# Patient Record
Sex: Female | Born: 1940 | Race: White | Hispanic: No | Smoking: Never smoker
Health system: Southern US, Community
[De-identification: ages and names within clinical notes are randomized; demographics above are authoritative.]

## PROBLEM LIST (undated history)

## (undated) DIAGNOSIS — I10 Essential (primary) hypertension: Secondary | ICD-10-CM

## (undated) DIAGNOSIS — C801 Malignant (primary) neoplasm, unspecified: Secondary | ICD-10-CM

## (undated) DIAGNOSIS — M1990 Unspecified osteoarthritis, unspecified site: Secondary | ICD-10-CM

## (undated) DIAGNOSIS — F419 Anxiety disorder, unspecified: Secondary | ICD-10-CM

## (undated) HISTORY — PX: CARPAL TUNNEL RELEASE: SHX101

## (undated) HISTORY — PX: ABDOMINAL HYSTERECTOMY: SHX81

## (undated) HISTORY — PX: HIATAL HERNIA REPAIR: SHX195

## (undated) HISTORY — PX: SKIN CANCER EXCISION: SHX779

## (undated) HISTORY — PX: KNEE ARTHROSCOPY: SHX127

---

## 2011-05-11 ENCOUNTER — Encounter (HOSPITAL_COMMUNITY): Payer: Self-pay | Admitting: Pharmacy Technician

## 2011-05-12 ENCOUNTER — Encounter (HOSPITAL_COMMUNITY): Payer: Medicare Other

## 2011-05-13 ENCOUNTER — Other Ambulatory Visit (HOSPITAL_COMMUNITY): Payer: Medicare Other

## 2011-05-13 NOTE — Patient Instructions (Addendum)
22 Kristin Wyatt  05/13/2011   Your procedure is scheduled on:  05/17/2011  Report to [facility] at  1000  AM.  Call this number if you have problems the morning of surgery: [phone]   Remember:   Do not eat food:After Midnight.  May have clear liquids:until Midnight .  Clear liquids include soda, tea, black coffee, apple or grape juice, broth.  Take these medicines the morning of surgery with A SIP OF WATER: xanax,diltiazem,hydroduiril   Do not wear jewelry, make-up or nail polish.  Do not wear lotions, powders, or perfumes. You may wear deodorant.  Do not shave 48 hours prior to surgery.  Do not bring valuables to the hospital.  Contacts, dentures or bridgework may not be worn into surgery.  Leave suitcase in the car. After surgery it may be brought to your room.  For patients admitted to the hospital, checkout time is 11:00 AM the day of discharge.   Patients discharged the day of surgery will not be allowed to drive home.  Name and phone number of your driver: family  Special Instructions: N/A   Please read over the following fact sheets that you were given: Pain Booklet, MRSA Information, Surgical Site Infection Prevention, Anesthesia Post-op Instructions and Care and Recovery After Surgery Cataract A cataract is a clouding of the lens of the eye. When a lens becomes cloudy, vision is reduced based on the degree and nature of the clouding. Many cataracts reduce vision to some degree. Some cataracts make people more near-sighted as they develop. Other cataracts increase glare. Cataracts that are ignored and become worse can sometimes look white. The white color can be seen through the pupil. CAUSES   Aging. However, cataracts may occur at any age, even in newborns.   Certain drugs.   Trauma to the eye.   Certain diseases such as diabetes.   Specific eye diseases such as chronic inflammation inside the eye or a sudden attack of a rare form of glaucoma.   Inherited or  acquired medical problems.  SYMPTOMS   Gradual, progressive drop in vision in the affected eye.   Severe, rapid visual loss. This most often happens when trauma is the cause.  DIAGNOSIS  To detect a cataract, an eye doctor examines the lens. Cataracts are best diagnosed with an exam of the eyes with the pupils enlarged (dilated) by drops.  TREATMENT  For an early cataract, vision may improve by using different eyeglasses or stronger lighting. If that does not help your vision, surgery is the only effective treatment. A cataract needs to be surgically removed when vision loss interferes with your everyday activities, such as driving, reading, or watching TV. A cataract may also have to be removed if it prevents examination or treatment of another eye problem. Surgery removes the cloudy lens and usually replaces it with a substitute lens (intraocular lens, IOL).  At a time when both you and your doctor agree, the cataract will be surgically removed. If you have cataracts in both eyes, only one is usually removed at a time. This allows the operated eye to heal and be out of danger from any possible problems after surgery (such as infection or poor wound healing). In rare cases, a cataract may be doing damage to your eye. In these cases, your caregiver may advise surgical removal right away. The vast majority of people who have cataract surgery have better vision afterward. HOME CARE INSTRUCTIONS  If you are not planning surgery, you may  be asked to do the following:  Use different eyeglasses.   Use stronger or brighter lighting.   Ask your eye doctor about reducing your medicine dose or changing medicines if it is thought that a medicine caused your cataract. Changing medicines does not make the cataract go away on its own.   Become familiar with your surroundings. Poor vision can lead to injury. Avoid bumping into things on the affected side. You are at a higher risk for tripping or falling.    Exercise extreme care when driving or operating machinery.   Wear sunglasses if you are sensitive to bright light or experiencing problems with glare.  SEEK IMMEDIATE MEDICAL CARE IF:   You have a worsening or sudden vision loss.   You notice redness, swelling, or increasing pain in the eye.   You have a fever.  Document Released: 02/22/2005 Document Revised: 02/11/2011 Document Reviewed: 10/16/2010 [facility] Patient Information 2012 [place], [place].PATIENT INSTRUCTIONS POST-ANESTHESIA  IMMEDIATELY FOLLOWING SURGERY:  Do not drive or operate machinery for the first twenty four hours after surgery.  Do not make any important decisions for twenty four hours after surgery or while taking narcotic pain medications or sedatives.  If you develop intractable nausea and vomiting or a severe headache please notify your doctor immediately.  FOLLOW-UP:  Please make an appointment with your surgeon as instructed. You do not need to follow up with anesthesia unless specifically instructed to do so.  WOUND CARE INSTRUCTIONS (if applicable):  Keep a dry clean dressing on the anesthesia/puncture wound site if there is drainage.  Once the wound has quit draining you may leave it open to air.  Generally you should leave the bandage intact for twenty four hours unless there is drainage.  If the epidural site drains for more than 36-48 hours please call the anesthesia department.  QUESTIONS?:  Please feel free to call your physician or the hospital operator if you have any questions, and they will be happy to assist you.     [facility] Anesthesia Department [address] [place] [phone]

## 2011-05-14 ENCOUNTER — Encounter (HOSPITAL_COMMUNITY)
Admission: RE | Admit: 2011-05-14 | Discharge: 2011-05-14 | Disposition: A | Discharge status: Home / Self Care | Payer: Medicare Other | Source: Ambulatory Visit | Attending: Ophthalmology | Admitting: Ophthalmology

## 2011-05-14 ENCOUNTER — Encounter (HOSPITAL_COMMUNITY): Payer: Self-pay

## 2011-05-14 ENCOUNTER — Other Ambulatory Visit: Payer: Self-pay

## 2011-05-14 HISTORY — DX: Malignant (primary) neoplasm, unspecified: C80.1

## 2011-05-14 HISTORY — DX: Unspecified osteoarthritis, unspecified site: M19.90

## 2011-05-14 HISTORY — DX: Essential (primary) hypertension: I10

## 2011-05-14 HISTORY — DX: Anxiety disorder, unspecified: F41.9

## 2011-05-14 LAB — BASIC METABOLIC PANEL
CO2: 30 mEq/L (ref 19–32)
Calcium: 9.9 mg/dL (ref 8.4–10.5)
Glucose, Bld: 93 mg/dL (ref 70–99)
Sodium: 141 mEq/L (ref 135–145)

## 2011-05-14 MED ORDER — LIDOCAINE HCL (PF) 1 % IJ SOLN
INTRAMUSCULAR | Status: AC
  Filled 2011-05-14: qty 2

## 2011-05-14 MED ORDER — TETRACAINE HCL 0.5 % OP SOLN
OPHTHALMIC | Status: AC
  Filled 2011-05-14: qty 2

## 2011-05-14 MED ORDER — CYCLOPENTOLATE-PHENYLEPHRINE 0.2-1 % OP SOLN
OPHTHALMIC | Status: AC
  Filled 2011-05-14: qty 2

## 2011-05-14 MED ORDER — NEOMYCIN-POLYMYXIN-DEXAMETH 3.5-10000-0.1 OP OINT
TOPICAL_OINTMENT | OPHTHALMIC | Status: AC
Start: 2011-05-14 — End: 2011-05-15
  Filled 2011-05-14: qty 3.5

## 2011-05-14 NOTE — Progress Notes (Signed)
05/14/11 1009  OBSTRUCTIVE SLEEP APNEA  Have you ever been diagnosed with sleep apnea through a sleep study? No  Do you snore loudly (loud enough to be heard through closed doors)?  1  Do you often feel tired, fatigued, or sleepy during the daytime? 1  Has anyone observed you stop breathing during your sleep? 0  Do you have, or are you being treated for high blood pressure? 1  BMI more than 35 kg/m2? 0  Age over 69 years old? 1  Neck circumference greater than 40 cm/18 inches? 0  Gender: 0  Obstructive Sleep Apnea Score 4   Score 4 or greater  Updated health history

## 2011-05-17 ENCOUNTER — Encounter (HOSPITAL_COMMUNITY): Payer: Self-pay | Admitting: Anesthesiology

## 2011-05-17 ENCOUNTER — Encounter (HOSPITAL_COMMUNITY)
Admission: RE | Disposition: A | Discharge status: Home / Self Care | Payer: Self-pay | Source: Ambulatory Visit | Attending: Ophthalmology

## 2011-05-17 ENCOUNTER — Ambulatory Visit (HOSPITAL_COMMUNITY)
Admission: RE | Admit: 2011-05-17 | Discharge: 2011-05-17 | Disposition: A | Discharge status: Home / Self Care | Payer: Medicare Other | Source: Ambulatory Visit | Attending: Ophthalmology | Admitting: Ophthalmology

## 2011-05-17 ENCOUNTER — Encounter (HOSPITAL_COMMUNITY): Payer: Self-pay | Admitting: *Deleted

## 2011-05-17 ENCOUNTER — Ambulatory Visit (HOSPITAL_COMMUNITY): Payer: Medicare Other | Admitting: Anesthesiology

## 2011-05-17 DIAGNOSIS — Z01812 Encounter for preprocedural laboratory examination: Secondary | ICD-10-CM | POA: Insufficient documentation

## 2011-05-17 DIAGNOSIS — I10 Essential (primary) hypertension: Secondary | ICD-10-CM | POA: Insufficient documentation

## 2011-05-17 DIAGNOSIS — Z01810 Encounter for preprocedural cardiovascular examination: Secondary | ICD-10-CM | POA: Insufficient documentation

## 2011-05-17 DIAGNOSIS — H2510 Age-related nuclear cataract, unspecified eye: Secondary | ICD-10-CM | POA: Insufficient documentation

## 2011-05-17 DIAGNOSIS — Z79899 Other long term (current) drug therapy: Secondary | ICD-10-CM | POA: Insufficient documentation

## 2011-05-17 HISTORY — PX: CATARACT EXTRACTION W/PHACO: SHX586

## 2011-05-17 SURGERY — PHACOEMULSIFICATION, CATARACT, WITH IOL INSERTION
Anesthesia: Monitor Anesthesia Care | Site: Eye | Laterality: Right | Wound class: Clean

## 2011-05-17 MED ORDER — LIDOCAINE 3.5 % OP GEL OPTIME - NO CHARGE
OPHTHALMIC | Status: DC | PRN
  Administered 2011-05-17: 1 [drp] via OPHTHALMIC

## 2011-05-17 MED ORDER — LACTATED RINGERS IV SOLN
INTRAVENOUS | Status: DC
  Administered 2011-05-17: 1000 mL via INTRAVENOUS

## 2011-05-17 MED ORDER — CYCLOPENTOLATE-PHENYLEPHRINE 0.2-1 % OP SOLN
1.0000 [drp] | OPHTHALMIC | Status: AC
  Administered 2011-05-17 (×3): 1 [drp] via OPHTHALMIC

## 2011-05-17 MED ORDER — BSS IO SOLN
INTRAOCULAR | Status: DC | PRN
  Administered 2011-05-17: 15 mL via INTRAOCULAR

## 2011-05-17 MED ORDER — MIDAZOLAM HCL 2 MG/2ML IJ SOLN
INTRAMUSCULAR | Status: AC
  Filled 2011-05-17: qty 2

## 2011-05-17 MED ORDER — MIDAZOLAM HCL 2 MG/2ML IJ SOLN
1.0000 mg | INTRAMUSCULAR | Status: DC | PRN
  Administered 2011-05-17 (×2): 2 mg via INTRAVENOUS

## 2011-05-17 MED ORDER — ONDANSETRON HCL 4 MG/2ML IJ SOLN: 4.0000 mg | Freq: Once | INTRAMUSCULAR | Status: DC | PRN

## 2011-05-17 MED ORDER — EPINEPHRINE HCL 1 MG/ML IJ SOLN
INTRAMUSCULAR | Status: AC
  Filled 2011-05-17: qty 1

## 2011-05-17 MED ORDER — PHENYLEPHRINE HCL 2.5 % OP SOLN
OPHTHALMIC | Status: AC
  Administered 2011-05-17: 1 [drp] via OPHTHALMIC
  Filled 2011-05-17: qty 2

## 2011-05-17 MED ORDER — EPINEPHRINE HCL 1 MG/ML IJ SOLN
INTRAOCULAR | Status: DC | PRN
  Administered 2011-05-17: 12:00:00

## 2011-05-17 MED ORDER — PROVISC 10 MG/ML IO SOLN
INTRAOCULAR | Status: DC | PRN
  Administered 2011-05-17: .85 mL via INTRAOCULAR

## 2011-05-17 MED ORDER — PHENYLEPHRINE HCL 2.5 % OP SOLN
1.0000 [drp] | OPHTHALMIC | Status: AC
  Administered 2011-05-17 (×3): 1 [drp] via OPHTHALMIC

## 2011-05-17 MED ORDER — MIDAZOLAM HCL 2 MG/2ML IJ SOLN
INTRAMUSCULAR | Status: AC
  Administered 2011-05-17: 2 mg via INTRAVENOUS
  Filled 2011-05-17: qty 2

## 2011-05-17 MED ORDER — LIDOCAINE HCL 3.5 % OP GEL
1.0000 "application " | Freq: Once | OPHTHALMIC | Status: AC
  Administered 2011-05-17: 1 via OPHTHALMIC

## 2011-05-17 MED ORDER — TETRACAINE HCL 0.5 % OP SOLN
1.0000 [drp] | OPHTHALMIC | Status: AC
  Administered 2011-05-17 (×3): 1 [drp] via OPHTHALMIC

## 2011-05-17 MED ORDER — FENTANYL CITRATE 0.05 MG/ML IJ SOLN: 25.0000 ug | INTRAMUSCULAR | Status: DC | PRN

## 2011-05-17 MED ORDER — POVIDONE-IODINE 5 % OP SOLN
OPHTHALMIC | Status: DC | PRN
  Administered 2011-05-17: 1 via OPHTHALMIC

## 2011-05-17 MED ORDER — LIDOCAINE HCL (PF) 1 % IJ SOLN
INTRAMUSCULAR | Status: DC | PRN
  Administered 2011-05-17: .6 mL

## 2011-05-17 MED ORDER — NEOMYCIN-POLYMYXIN-DEXAMETH 0.1 % OP OINT
TOPICAL_OINTMENT | OPHTHALMIC | Status: DC | PRN
  Administered 2011-05-17: 1 via OPHTHALMIC

## 2011-05-17 SURGICAL SUPPLY — 32 items

## 2011-05-17 NOTE — Transfer of Care (Signed)
Immediate Anesthesia Transfer of Care Note  Patient: Kristin Wyatt  Procedure(s) Performed: Procedure(s) (LRB): CATARACT EXTRACTION PHACO AND INTRAOCULAR LENS PLACEMENT (IOC) (Right)  Patient Location: PACU and Short Stay  Anesthesia Type: MAC  Level of Consciousness: awake, alert , oriented and patient cooperative  Airway & Oxygen Therapy: Patient Spontanous Breathing  Post-op Assessment: Report given to PACU RN, Post -op Vital signs reviewed and stable and Patient moving all extremities  Post vital signs: Reviewed and stable  Complications: No apparent anesthesia complications

## 2011-05-17 NOTE — H&P (Signed)
I have reviewed the H&P, the patient was re-examined, and I have identified no interval changes in medical condition and plan of care since the history and physical of record  

## 2011-05-17 NOTE — Anesthesia Postprocedure Evaluation (Signed)
  Anesthesia Post-op Note  Patient: Kristin Wyatt  Procedure(s) Performed: Procedure(s) (LRB): CATARACT EXTRACTION PHACO AND INTRAOCULAR LENS PLACEMENT (IOC) (Right)  Patient Location: PACU and Short Stay  Anesthesia Type: MAC  Level of Consciousness: awake, alert , oriented and patient cooperative  Airway and Oxygen Therapy: Patient Spontanous Breathing  Post-op Pain: none  Post-op Assessment: Post-op Vital signs reviewed, Patient's Cardiovascular Status Stable, PATIENT'S CARDIOVASCULAR STATUS UNSTABLE, Patent Airway, No signs of Nausea or vomiting and Pain level controlled  Post-op Vital Signs: Reviewed and stable  Complications: No apparent anesthesia complications

## 2011-05-17 NOTE — Anesthesia Preprocedure Evaluation (Signed)
Anesthesia Evaluation  Patient identified by MRN, date of birth, ID band Patient awake    Reviewed: Allergy & Precautions, H&P , NPO status , Patient's Chart, lab work & pertinent test results  Airway Mallampati: II      Dental  (+) Upper Dentures   Pulmonary neg pulmonary ROS,  breath sounds clear to auscultation        Cardiovascular hypertension, Pt. on medications Rhythm:Regular     Neuro/Psych Anxiety    GI/Hepatic   Endo/Other    Renal/GU      Musculoskeletal   Abdominal   Peds  Hematology   Anesthesia Other Findings   Reproductive/Obstetrics                           Anesthesia Physical Anesthesia Plan  ASA: II  Anesthesia Plan: MAC   Post-op Pain Management:    Induction: Intravenous  Airway Management Planned: Nasal Cannula  Additional Equipment:   Intra-op Plan:   Post-operative Plan:   Informed Consent: I have reviewed the patients History and Physical, chart, labs and discussed the procedure including the risks, benefits and alternatives for the proposed anesthesia with the patient or authorized representative who has indicated his/her understanding and acceptance.     Plan Discussed with:   Anesthesia Plan Comments:         Anesthesia Quick Evaluation

## 2011-05-17 NOTE — Discharge Instructions (Signed)
Kristin Wyatt  05/17/2011     Instructions  1. Use medications as Instructed.  Shake well before use. Wait 5 minutes between drops.  {OPHTHALMIC ANTIBIOTICS:22167} 4 times a day x 1 week.  {OPHTHALMIC ANTI-INFLAMMATORY:22168} 2 times a day x 4 weeks.  {OPHTHALMIC STEROID:22169} 4 times a day - week 1   3 times a day - Week 2, 2 times a day- Week 3, 1 time a day - Week 4.  2. Do not rub the operative eye. Do not swim underwater for 2 weeks.  3. You may remove the clear shield and resume your normal activities the day after  Surgery. Your eyes may feel more comfortable if you wear dark glasses outside.  4. Call our office at [phone] if you have sudden change in vision, extreme redness or pain. Some fluctuation in vision is normal after surgery. If you have an emergency after hours, call Dr. Alto Denver at [phone].  5. It is important that you attend all of your follow-up appointments.        Follow-up:{follow up:32580} with Gemma Payor, MD.   Dr. Lahoma Crocker: [phone]  Dr. Lita Mains: [phone]  Dr. Alto Denver: [phone]   If you find that you cannot contact your physician, but feel that your signs and   Symptoms warrant a physician's attention, call the Emergency Room at   [phone] ext.[phone].   Other{NA AND WUXLKGMW:10272}.

## 2011-05-17 NOTE — Brief Op Note (Signed)
Pre-Op Dx: Cataract OD Post-Op Dx: Cataract OD Surgeon: Hades Mathew Anesthesia: Topical with MAC Implant: B&L enVista Specimen: None Complications: None 

## 2011-05-18 NOTE — Op Note (Signed)
NAMEISABELL, Kristin Wyatt               ACCOUNT NO.:  [id number]  MEDICAL RECORD NO.:  [id number]  LOCATION:  [facility]PO                          FACILITY:  [facility]  PHYSICIAN:  Susanne Greenhouse, MD       DATE OF BIRTH:  07/14/1940  DATE OF PROCEDURE:  05/17/2011 DATE OF DISCHARGE:  05/17/2011                              OPERATIVE REPORT   PREOPERATIVE DIAGNOSES:  Nuclear cataract, right eye, diagnosis code 366.16.  POSTOPERATIVE DIAGNOSIS:  Nuclear cataract, right eye, diagnosis code 366.16.  OPERATION PERFORMED:  Phacoemulsification with posterior chamber intraocular lens implantation, right eye.  SURGEON:  Bonne Dolores. Kaprice Kage, MD  ANESTHESIA:  General endotracheal anesthesia.  OPERATIVE SUMMARY:  In the preoperative area, dilating drops were placed into the right eye.  The patient was then brought into the operating room where she was placed under general anesthesia.  The eye was then prepped and draped.  Beginning with a 75 blade, a paracentesis port was made at the surgeon's 2 o'clock position.  The anterior chamber was then filled with a 1% nonpreserved lidocaine solution with epinephrine.  This was followed by Viscoat to deepen the chamber.  A small fornix-based peritomy was performed superiorly.  Next, a single iris hook was placed through the limbus superiorly.  A 2.4-mm keratome blade was then used to make a clear corneal incision over the iris hook.  A bent cystotome needle and Utrata forceps were used to create a continuous tear capsulotomy.  Hydrodissection was performed using balanced salt solution on a fine cannula.  The lens nucleus was then removed using phacoemulsification in a quadrant cracking technique.  The cortical material was then removed with irrigation and aspiration.  The capsular bag and anterior chamber were refilled with Provisc.  The wound was widened to approximately 3 mm and a posterior chamber intraocular lens was placed into the capsular bag without difficulty  using an [employer] lens injecting system.  A single 10-0 nylon suture was then used to close the incision as well as stromal hydration.  The Provisc was removed from the anterior chamber and capsular bag with irrigation and aspiration.  At this point, the wounds were tested for leak, which were negative.  The anterior chamber remained deep and stable.  The patient tolerated the procedure well.  There were no operative complications, and she awoke from general anesthesia without problem.  No surgical specimens.  Prosthetic device used is a Actuary, EnVista posterior chamber lens, model MX60, power of 19.5, serial number is [id number].          ______________________________ Susanne Greenhouse, MD     KEH/MEDQ  D:  05/17/2011  T:  05/18/2011  Job:  [id number]

## 2011-05-19 ENCOUNTER — Encounter (HOSPITAL_COMMUNITY): Payer: Self-pay | Admitting: Ophthalmology

## 2011-05-24 ENCOUNTER — Encounter (HOSPITAL_COMMUNITY): Payer: Self-pay | Admitting: Pharmacy Technician

## 2011-05-28 ENCOUNTER — Encounter (HOSPITAL_COMMUNITY)
Admission: RE | Admit: 2011-05-28 | Discharge: 2011-05-28 | Payer: Medicare Other | Source: Ambulatory Visit | Admitting: Ophthalmology

## 2011-05-31 ENCOUNTER — Encounter (HOSPITAL_COMMUNITY): Payer: Self-pay | Admitting: Anesthesiology

## 2011-05-31 ENCOUNTER — Ambulatory Visit (HOSPITAL_COMMUNITY): Payer: Medicare Other | Admitting: Anesthesiology

## 2011-05-31 ENCOUNTER — Encounter (HOSPITAL_COMMUNITY): Payer: Self-pay

## 2011-05-31 ENCOUNTER — Ambulatory Visit (HOSPITAL_COMMUNITY)
Admission: RE | Admit: 2011-05-31 | Discharge: 2011-05-31 | Disposition: A | Discharge status: Home / Self Care | Payer: Medicare Other | Source: Ambulatory Visit | Attending: Ophthalmology | Admitting: Ophthalmology

## 2011-05-31 ENCOUNTER — Encounter (HOSPITAL_COMMUNITY)
Admission: RE | Disposition: A | Discharge status: Home / Self Care | Payer: Self-pay | Source: Ambulatory Visit | Attending: Ophthalmology

## 2011-05-31 DIAGNOSIS — I10 Essential (primary) hypertension: Secondary | ICD-10-CM | POA: Insufficient documentation

## 2011-05-31 DIAGNOSIS — Z79899 Other long term (current) drug therapy: Secondary | ICD-10-CM | POA: Insufficient documentation

## 2011-05-31 DIAGNOSIS — H2510 Age-related nuclear cataract, unspecified eye: Secondary | ICD-10-CM | POA: Insufficient documentation

## 2011-05-31 HISTORY — PX: CATARACT EXTRACTION W/PHACO: SHX586

## 2011-05-31 SURGERY — PHACOEMULSIFICATION, CATARACT, WITH IOL INSERTION
Anesthesia: Monitor Anesthesia Care | Site: Eye | Laterality: Left | Wound class: Clean

## 2011-05-31 MED ORDER — BSS IO SOLN
INTRAOCULAR | Status: DC | PRN
  Administered 2011-05-31: 15 mL via INTRAOCULAR

## 2011-05-31 MED ORDER — MIDAZOLAM HCL 2 MG/2ML IJ SOLN
1.0000 mg | INTRAMUSCULAR | Status: DC | PRN
  Administered 2011-05-31: 2 mg via INTRAVENOUS

## 2011-05-31 MED ORDER — PHENYLEPHRINE HCL 2.5 % OP SOLN
OPHTHALMIC | Status: AC
  Administered 2011-05-31: 1 [drp] via OPHTHALMIC
  Filled 2011-05-31: qty 2

## 2011-05-31 MED ORDER — MIDAZOLAM HCL 2 MG/2ML IJ SOLN
INTRAMUSCULAR | Status: AC
  Administered 2011-05-31: 2 mg via INTRAVENOUS
  Filled 2011-05-31: qty 2

## 2011-05-31 MED ORDER — CYCLOPENTOLATE-PHENYLEPHRINE 0.2-1 % OP SOLN
1.0000 [drp] | OPHTHALMIC | Status: AC
  Administered 2011-05-31 (×3): 1 [drp] via OPHTHALMIC

## 2011-05-31 MED ORDER — PROVISC 10 MG/ML IO SOLN
INTRAOCULAR | Status: DC | PRN
  Administered 2011-05-31: .85 mL via INTRAOCULAR

## 2011-05-31 MED ORDER — EPINEPHRINE HCL 1 MG/ML IJ SOLN
INTRAMUSCULAR | Status: AC
  Filled 2011-05-31: qty 1

## 2011-05-31 MED ORDER — FENTANYL CITRATE 0.05 MG/ML IJ SOLN: 25.0000 ug | INTRAMUSCULAR | Status: DC | PRN

## 2011-05-31 MED ORDER — NEOMYCIN-POLYMYXIN-DEXAMETH 0.1 % OP OINT
TOPICAL_OINTMENT | OPHTHALMIC | Status: DC | PRN
  Administered 2011-05-31: 1 via OPHTHALMIC

## 2011-05-31 MED ORDER — EPINEPHRINE HCL 1 MG/ML IJ SOLN
INTRAMUSCULAR | Status: DC | PRN
  Administered 2011-05-31: 13:00:00

## 2011-05-31 MED ORDER — LACTATED RINGERS IV SOLN
INTRAVENOUS | Status: DC
  Administered 2011-05-31: 12:00:00 via INTRAVENOUS

## 2011-05-31 MED ORDER — LIDOCAINE 3.5 % OP GEL OPTIME - NO CHARGE
OPHTHALMIC | Status: DC | PRN
  Administered 2011-05-31: 1 [drp] via OPHTHALMIC

## 2011-05-31 MED ORDER — ONDANSETRON HCL 4 MG/2ML IJ SOLN: 4.0000 mg | Freq: Once | INTRAMUSCULAR | Status: DC | PRN

## 2011-05-31 MED ORDER — LIDOCAINE HCL (PF) 1 % IJ SOLN
INTRAMUSCULAR | Status: DC | PRN
  Administered 2011-05-31: .4 mL

## 2011-05-31 MED ORDER — POVIDONE-IODINE 5 % OP SOLN
OPHTHALMIC | Status: DC | PRN
  Administered 2011-05-31: 1 via OPHTHALMIC

## 2011-05-31 MED ORDER — TETRACAINE HCL 0.5 % OP SOLN
1.0000 [drp] | OPHTHALMIC | Status: AC
  Administered 2011-05-31 (×3): 1 [drp] via OPHTHALMIC

## 2011-05-31 MED ORDER — PHENYLEPHRINE HCL 2.5 % OP SOLN
1.0000 [drp] | OPHTHALMIC | Status: AC
  Administered 2011-05-31 (×3): 1 [drp] via OPHTHALMIC

## 2011-05-31 SURGICAL SUPPLY — 32 items

## 2011-05-31 NOTE — Anesthesia Preprocedure Evaluation (Signed)
Anesthesia Evaluation  Patient identified by MRN, date of birth, ID band Patient awake    Reviewed: Allergy & Precautions, H&P , NPO status , Patient's Chart, lab work & pertinent test results  Airway Mallampati: II      Dental  (+) Upper Dentures   Pulmonary neg pulmonary ROS,  breath sounds clear to auscultation        Cardiovascular hypertension, Pt. on medications Rhythm:Regular     Neuro/Psych Anxiety    GI/Hepatic   Endo/Other    Renal/GU      Musculoskeletal   Abdominal   Peds  Hematology   Anesthesia Other Findings   Reproductive/Obstetrics                           Anesthesia Physical Anesthesia Plan  ASA: II  Anesthesia Plan: MAC   Post-op Pain Management:    Induction: Intravenous  Airway Management Planned: Nasal Cannula  Additional Equipment:   Intra-op Plan:   Post-operative Plan:   Informed Consent: I have reviewed the patients History and Physical, chart, labs and discussed the procedure including the risks, benefits and alternatives for the proposed anesthesia with the patient or authorized representative who has indicated his/her understanding and acceptance.     Plan Discussed with:   Anesthesia Plan Comments:         Anesthesia Quick Evaluation  

## 2011-05-31 NOTE — H&P (Signed)
I have reviewed the H&P, the patient was re-examined, and I have identified no interval changes in medical condition and plan of care since the history and physical of record  

## 2011-05-31 NOTE — Anesthesia Postprocedure Evaluation (Signed)
  Anesthesia Post-op Note  Patient: Kristin Wyatt  Procedure(s) Performed: Procedure(s) (LRB): CATARACT EXTRACTION PHACO AND INTRAOCULAR LENS PLACEMENT (IOC) (Left)  Patient Location:  Short Stay  Anesthesia Type: MAC  Level of Consciousness: awake  Airway and Oxygen Therapy: Patient Spontanous Breathing  Post-op Pain: none  Post-op Assessment: Post-op Vital signs reviewed, Patient's Cardiovascular Status Stable, Respiratory Function Stable, Patent Airway, No signs of Nausea or vomiting and Pain level controlled  Post-op Vital Signs: Reviewed and stable  Complications: No apparent anesthesia complications

## 2011-05-31 NOTE — Discharge Instructions (Signed)
Kristin Wyatt  05/31/2011     Instructions  1. Use medications as Instructed.  Shake well before use. Wait 5 minutes between drops.  {OPHTHALMIC ANTIBIOTICS:22167} 4 times a day x 1 week.  {OPHTHALMIC ANTI-INFLAMMATORY:22168} 2 times a day x 4 weeks.  {OPHTHALMIC STEROID:22169} 4 times a day - week 1   3 times a day - Week 2, 2 times a day- Week 3, 1 time a day - Week 4.  2. Do not rub the operative eye. Do not swim underwater for 2 weeks.  3. You may remove the clear shield and resume your normal activities the day after  Surgery. Your eyes may feel more comfortable if you wear dark glasses outside.  4. Call our office at [phone] if you have sudden change in vision, extreme redness or pain. Some fluctuation in vision is normal after surgery. If you have an emergency after hours, call Dr. Alto Denver at [phone].  5. It is important that you attend all of your follow-up appointments.        Follow-up:{follow up:32580} with Gemma Payor, MD.   Dr. Lahoma Crocker: [phone]  Dr. Lita Mains: [phone]  Dr. Alto Denver: [phone]   If you find that you cannot contact your physician, but feel that your signs and   Symptoms warrant a physician's attention, call the Emergency Room at   [phone].   Other{NA AND WUXLKGMW:10272}.

## 2011-05-31 NOTE — Brief Op Note (Signed)
Pre-Op Dx: Cataract OS Post-Op Dx: Cataract OS Surgeon: Atalya Dano Anesthesia: Topical with MAC Implant: B&L enVista Specimen: None Complications: None 

## 2011-05-31 NOTE — Op Note (Signed)
Kristin Wyatt, Kristin Wyatt               ACCOUNT NO.:  [id number]  MEDICAL RECORD NO.:  [id number]  LOCATION:  [facility]                          FACILITY:  [facility]  PHYSICIAN:  Susanne Greenhouse, MD       DATE OF BIRTH:  [birth year]  DATE OF PROCEDURE:  05/31/2011 DATE OF DISCHARGE:  05/31/2011                              OPERATIVE REPORT   PREOPERATIVE DIAGNOSES:  Nuclear cataract, left eye, diagnosis code 366.16.  POSTOPERATIVE DIAGNOSIS:  Nuclear cataract, left eye, diagnosis code 366.16.  OPERATION PERFORMED:  Phacoemulsification with posterior chamber intraocular lens implantation, left eye.  SURGEON:  Bonne Dolores. Uzma Hellmer, MD  ANESTHESIA:  General endotracheal anesthesia.  OPERATIVE SUMMARY:  In the preoperative area, dilating drops were placed into the left eye.  The patient was then brought into the operating room where she was placed under general anesthesia.  The eye was then prepped and draped.  Beginning with a 75 blade, a paracentesis port was made at the surgeon's 2 o'clock position.  The anterior chamber was then filled with a 1% nonpreserved lidocaine solution with epinephrine.  This was followed by Viscoat to deepen the chamber.  A small fornix-based peritomy was performed superiorly.  Next, a single iris hook was placed through the limbus superiorly.  A 2.4-mm keratome blade was then used to make a clear corneal incision over the iris hook.  A bent cystotome needle and Utrata forceps were used to create a continuous tear capsulotomy.  Hydrodissection was performed using balanced salt solution on a fine cannula.  The lens nucleus was then removed using phacoemulsification in a quadrant cracking technique.  The cortical material was then removed with irrigation and aspiration.  The capsular bag and anterior chamber were refilled with Provisc.  The wound was widened to approximately 3 mm and a posterior chamber intraocular lens was placed into the capsular bag without difficulty using  an [employer] lens injecting system.  A single 10-0 nylon suture was then used to close the incision as well as stromal hydration.  The Provisc was removed from the anterior chamber and capsular bag with irrigation and aspiration.  At this point, the wounds were tested for leak, which were negative.  The anterior chamber remained deep and stable.  The patient tolerated the procedure well.  There were no operative complications, and she awoke from general anesthesia without problem.  No surgical specimens.  Prosthetic device used is a [employer]in [employer] posterior chamber lens, model MX60, power of 19.5, serial number is [id number].          ______________________________ Susanne Greenhouse, MD     KEH/MEDQ  D:  05/31/2011  T:  05/31/2011  Job:  [id number]

## 2011-05-31 NOTE — Transfer of Care (Signed)
Immediate Anesthesia Transfer of Care Note  Patient: Kristin Wyatt  Procedure(s) Performed: Procedure(s) (LRB): CATARACT EXTRACTION PHACO AND INTRAOCULAR LENS PLACEMENT (IOC) (Left)  Patient Location: Shortstay  Anesthesia Type: MAC  Level of Consciousness: awake  Airway & Oxygen Therapy: Patient Spontanous Breathing   Post-op Assessment: Report given to PACU RN, Post -op Vital signs reviewed and stable and Patient moving all extremities  Post vital signs: Reviewed and stable  Complications: No apparent anesthesia complications

## 2011-05-31 NOTE — Anesthesia Procedure Notes (Signed)
Procedure Name: MAC Date/Time: 05/31/2011 12:50 PM Performed by: Franco Nones Pre-anesthesia Checklist: Patient identified, Emergency Drugs available, Suction available, Timeout performed and Patient being monitored Patient Re-evaluated:Patient Re-evaluated prior to inductionOxygen Delivery Method: Nasal Cannula

## 2011-06-03 ENCOUNTER — Encounter (HOSPITAL_COMMUNITY): Payer: Self-pay | Admitting: Ophthalmology

## 2012-08-31 ENCOUNTER — Encounter (HOSPITAL_COMMUNITY): Payer: Self-pay | Admitting: *Deleted

## 2012-08-31 ENCOUNTER — Emergency Department (HOSPITAL_COMMUNITY): Payer: Medicare Other

## 2012-08-31 ENCOUNTER — Emergency Department (HOSPITAL_COMMUNITY)
Admission: EM | Admit: 2012-08-31 | Discharge: 2012-08-31 | Disposition: A | Discharge status: Home / Self Care | Payer: Medicare Other | Attending: Emergency Medicine | Admitting: Emergency Medicine

## 2012-08-31 DIAGNOSIS — I82811 Embolism and thrombosis of superficial veins of right lower extremities: Secondary | ICD-10-CM

## 2012-08-31 DIAGNOSIS — Z79899 Other long term (current) drug therapy: Secondary | ICD-10-CM | POA: Insufficient documentation

## 2012-08-31 DIAGNOSIS — R791 Abnormal coagulation profile: Secondary | ICD-10-CM | POA: Insufficient documentation

## 2012-08-31 DIAGNOSIS — M79609 Pain in unspecified limb: Secondary | ICD-10-CM

## 2012-08-31 DIAGNOSIS — M129 Arthropathy, unspecified: Secondary | ICD-10-CM | POA: Insufficient documentation

## 2012-08-31 DIAGNOSIS — R0789 Other chest pain: Secondary | ICD-10-CM | POA: Insufficient documentation

## 2012-08-31 DIAGNOSIS — R0602 Shortness of breath: Secondary | ICD-10-CM | POA: Insufficient documentation

## 2012-08-31 DIAGNOSIS — Z85828 Personal history of other malignant neoplasm of skin: Secondary | ICD-10-CM | POA: Insufficient documentation

## 2012-08-31 DIAGNOSIS — Z885 Allergy status to narcotic agent status: Secondary | ICD-10-CM | POA: Insufficient documentation

## 2012-08-31 DIAGNOSIS — F411 Generalized anxiety disorder: Secondary | ICD-10-CM | POA: Insufficient documentation

## 2012-08-31 DIAGNOSIS — R110 Nausea: Secondary | ICD-10-CM | POA: Insufficient documentation

## 2012-08-31 DIAGNOSIS — I8010 Phlebitis and thrombophlebitis of unspecified femoral vein: Secondary | ICD-10-CM | POA: Insufficient documentation

## 2012-08-31 DIAGNOSIS — R61 Generalized hyperhidrosis: Secondary | ICD-10-CM | POA: Insufficient documentation

## 2012-08-31 DIAGNOSIS — M6281 Muscle weakness (generalized): Secondary | ICD-10-CM | POA: Insufficient documentation

## 2012-08-31 DIAGNOSIS — I10 Essential (primary) hypertension: Secondary | ICD-10-CM | POA: Insufficient documentation

## 2012-08-31 LAB — CBC
HCT: 41 % (ref 36.0–46.0)
Hemoglobin: 13.4 g/dL (ref 12.0–15.0)
MCH: 29 pg (ref 26.0–34.0)
MCHC: 32.7 g/dL (ref 30.0–36.0)
MCV: 88.7 fL (ref 78.0–100.0)
Platelets: 183 K/uL (ref 150–400)
RBC: 4.62 MIL/uL (ref 3.87–5.11)
RDW: 13.6 % (ref 11.5–15.5)
WBC: 4.9 K/uL (ref 4.0–10.5)

## 2012-08-31 LAB — BASIC METABOLIC PANEL WITH GFR
BUN: 10 mg/dL (ref 6–23)
CO2: 31 meq/L (ref 19–32)
Calcium: 9 mg/dL (ref 8.4–10.5)
Chloride: 98 meq/L (ref 96–112)
Creatinine, Ser: 0.69 mg/dL (ref 0.50–1.10)
GFR calc Af Amer: >90 mL/min
GFR calc non Af Amer: 87 mL/min — ABNORMAL LOW
Glucose, Bld: 88 mg/dL (ref 70–99)
Potassium: 3.7 meq/L (ref 3.5–5.1)
Sodium: 138 meq/L (ref 135–145)

## 2012-08-31 LAB — POCT I-STAT TROPONIN I: Troponin i, poc: 0 ng/mL (ref 0.00–0.08)

## 2012-08-31 LAB — D-DIMER, QUANTITATIVE: D-Dimer, Quant: 1.15 ug{FEU}/mL — ABNORMAL HIGH (ref 0.00–0.48)

## 2012-08-31 MED ORDER — ONDANSETRON 4 MG PO TBDP
4.0000 mg | ORAL_TABLET | Freq: Once | ORAL | Status: AC
  Administered 2012-08-31: 4 mg via ORAL
  Filled 2012-08-31: qty 1

## 2012-08-31 MED ORDER — ONDANSETRON 4 MG PO TBDP
ORAL_TABLET | ORAL | Status: AC
  Filled 2012-08-31: qty 2

## 2012-08-31 MED ORDER — IOHEXOL 350 MG/ML SOLN
100.0000 mL | Freq: Once | INTRAVENOUS | Status: AC | PRN
  Administered 2012-08-31: 100 mL via INTRAVENOUS

## 2012-08-31 MED ORDER — ONDANSETRON 4 MG PO TBDP
8.0000 mg | ORAL_TABLET | Freq: Once | ORAL | Status: AC
  Administered 2012-08-31: 8 mg via ORAL

## 2012-08-31 NOTE — ED Provider Notes (Signed)
History    CSN: [id number] Arrival date & time 08/31/12  1227  First MD Initiated Contact with Patient 08/31/12 1345     Chief Complaint  Patient presents with  . Chest Pain   (Consider location/radiation/quality/duration/timing/severity/associated sxs/prior Treatment) HPI  72 year old female the past medical history of hypertension and anxiety attacks presents to the emergency department with chief complaint of chest discomfort.  Patient states that 2 days ago on Tuesday, 08/29/2012 she had a sudden onset chest tightness and shortness of breath which lasted  Approximately 15 minutes and then resolved.  She denies any nausea, vomiting, diaphoresis at that time. Patient states that this morning she awoke with extreme nausea without vomiting, diaphoresis and shortness of breath.  All of that has resolved except for mild nausea at this time.  She denies any vomiting, abdominal pain or diarrhea.  Patient states tha   Past Medical History  Diagnosis Date  . Hypertension   . Anxiety   . Cancer     scalp  . Arthritis     knees   Past Surgical History  Procedure Laterality Date  . Abdominal hysterectomy    . Skin cancer excision      back of neck-basal cell ca  . Hiatal hernia repair      [facility]  . Carpal tunnel release      bilateral  . Knee arthroscopy      left  . Cataract extraction w/phaco  05/17/2011    Procedure: CATARACT EXTRACTION PHACO AND INTRAOCULAR LENS PLACEMENT (IOC);  Surgeon: Gemma Payor, MD;  Location: [facility];  Service: Ophthalmology;  Laterality: Right;  CDE: 7.72  . Cataract extraction w/phaco  05/31/2011    Procedure: CATARACT EXTRACTION PHACO AND INTRAOCULAR LENS PLACEMENT (IOC);  Surgeon: Gemma Payor, MD;  Location: [facility];  Service: Ophthalmology;  Laterality: Left;  CDE: 21.01   Family History  Problem Relation Age of Onset  . Anesthesia problems Neg Hx   . Hypotension Neg Hx   . Malignant hyperthermia Neg Hx   . Pseudochol deficiency Neg Hx    History   Substance Use Topics  . Smoking status: Never Smoker   . Smokeless tobacco: Not on file  . Alcohol Use: No   OB History   Grav Para Term Preterm Abortions TAB SAB Ect Mult Living                 Review of Systems  Constitutional: Positive for activity change. Negative for fever, chills, appetite change and unexpected weight change.  HENT: Negative for trouble swallowing.   Respiratory: Positive for chest tightness and shortness of breath. Negative for cough, choking and wheezing.   Cardiovascular: Positive for chest pain. Negative for leg swelling.  Gastrointestinal: Negative for nausea, vomiting, abdominal pain, diarrhea and constipation.  Genitourinary: Negative for dysuria and hematuria.  Musculoskeletal: Negative for myalgias and arthralgias.  Skin: Negative for rash.  Neurological: Positive for weakness. Negative for numbness.  All other systems reviewed and are negative.    Allergies  Morphine and related  Home Medications   Current Outpatient Rx  Name  Route  Sig  Dispense  Refill  . acetaminophen (TYLENOL) 500 MG tablet   Oral   Take 500 mg by mouth every 6 (six) hours as needed. For pain         . ALPRAZolam (XANAX) 0.5 MG tablet   Oral   Take 0.5 mg by mouth as needed. For anxiety         .  diltiazem (DILACOR XR) 120 MG 24 hr capsule   Oral   Take 120 mg by mouth daily.         . hydrochlorothiazide (HYDRODIURIL) 25 MG tablet   Oral   Take 25 mg by mouth daily.          BP 124/68  Pulse 73  Temp(Src) 98.8 F (37.1 C) (Oral)  Resp 20  SpO2 97% Physical Exam Physical Exam  Nursing note and vitals reviewed. Constitutional: She is oriented to person, place, and time. She appears well-developed and well-nourished. No distress.  HENT:  Head: Normocephalic and atraumatic.  Eyes: Conjunctivae normal and EOM are normal. Pupils are equal, round, and reactive to light. No scleral icterus.  Neck: Normal range of motion.  Cardiovascular: Normal  rate, regular rhythm and normal heart sounds.  Exam reveals no gallop and no friction rub.  No Peripheral edema. Patient is TTP in the R superomedial calf without heat redness or swelling. Patient has palpable popliteal and peripheral pulses. No murmur heard. Pulmonary/Chest: Effort normal and breath sounds normal. No respiratory distress.  Abdominal: Soft. Bowel sounds are normal. She exhibits no distension and no mass. There is no tenderness. There is no guarding.  Neurological: She is alert and oriented to person, place, and time.  Skin: Skin is warm and dry. She is not diaphoretic.    ED Course  Procedures (including critical care time) Labs Reviewed  BASIC METABOLIC PANEL - Abnormal; Notable for the following:    GFR calc non Af Amer 87 (*)    All other components within normal limits  D-DIMER, QUANTITATIVE - Abnormal; Notable for the following:    D-Dimer, Quant 1.15 (*)    All other components within normal limits  CBC  POCT I-STAT TROPONIN I   Dg Chest 2 View  08/31/2012   *RADIOLOGY REPORT*  Clinical Data: Chest pain  CHEST - 2 VIEW  Comparison: None.  Findings: Mild cardiomegaly.  Mediastinal contours within normal limits.  There is pulmonary vascular congestion without overt edema.  No focal airspace consolidation.  No pneumothorax or pleural effusion.  There is an 8 mm nodular opacity in the right upper lung projecting just posterior to the mid right clavicle.  IMPRESSION:  1.  Cardiomegaly and mild pulmonary vascular congestion without overt edema.  2.  Probable 8 mm pulmonary nodule in the right lung apex. Recommend further evaluation with non emergent CT scan of the chest.   Original Report Authenticated By: Malachy Moan, M.D.   No diagnosis found.  MDM  3:41 PM Patient given zofran with some relief. Her d-dimer is elevated.  We will get a Doppler ultrasound of the leg to rule out a DVT.  Abdomen performed the patient of this finding.     4:05 PM Patient awaiting  doppler US.  Plan if pos paitne will need anticoagulation. If negative whe iwill need CTA chest.  Patient will need to be set up for CT chest regarding incidentaloma.  Her PCP is setting up Cardiac workup.  Arthor Captain, PA-C 08/31/12 1626

## 2012-08-31 NOTE — ED Notes (Signed)
Pt reports CP that started yesterday after getting up.  Reports nausea and weakness.  Pt went to her PCP this morning and was advised to come to the ED.  Pt reports nausea, diaphoresis and SOB.  Pt A/Ox 4.

## 2012-08-31 NOTE — ED Notes (Signed)
Pt presents with 2 week h/o fatigue and "knot" to R calf and onset of mid-sternal chest pain x 2 days.  +shortness of breath, +nausea and headache.

## 2012-08-31 NOTE — ED Provider Notes (Signed)
Pt with CP.  Sent here by PCP.  Has elevated d-dimer.  Doppler vascular study ordered.  If neg, then f/u with CTA, if positive then anticoagulate.  Pt has a cardiology f/u once discharge.  Pt also need a repeat chest CT in the near future to assess for incidental 8mm lesion which were noted on her CXR today.    6:09 PM Vascular on Doppler ultrasound for  right leg shows no evidence of DVT. There appears to be a superficial thrombus in the greater saphenous vein from the proximal calf to the distal thigh in pt's right leg. This is likely account for her leg pain as well as her elevated d-dimer. She has no chest pain or shortness of breath. Low suspicion for DVT or PE when consider the finding on ultrasound. I do not think a chest CTA is indicated at this time.  Pt also mentioned that her SOB is her baseline.  However, i discussed risk and benefit of having chest CTA and pt and family members agrees with having the scan to r/o PE.   9:16 PM Chest CTA is negative for PE, negative for lung nodule, and no other acute abnormalities. Patient is currently not in the room. Will try to locate patient.    Care instruction for superficial thrombosis was given. Patient will have close followup with her PCP for further management. Return precautions discussed.   Author: Smiley Houseman, RVT Service: Vascular Lab Author Type: Cardiovascular Sonographer   Filed: 08/31/2012 4:43 PM Note Time: 08/31/2012 4:41 PM         Right lower extremity venous duplex completed. Right: No evidence of DVT or Baker's cyst. There appears to be superficial thrombus in the greater saphenous vein from the proximal calf to the distal thigh. Left: Negative for DVT in the common femoral vein.   BP 127/63  Pulse 72  Temp(Src) 98.8 F (37.1 C) (Oral)  Resp 20  SpO2 97%  I have reviewed nursing notes and vital signs. I personally reviewed the imaging tests through PACS system  I reviewed available ER/hospitalization records thought  the EMR  Results for orders placed during the hospital encounter of 08/31/12  CBC      Result Value Range   WBC 4.9  4.0 - 10.5 K/uL   RBC 4.62  3.87 - 5.11 MIL/uL   Hemoglobin 13.4  12.0 - 15.0 g/dL   HCT 16.1  09.6 - 04.5 %   MCV 88.7  78.0 - 100.0 fL   MCH 29.0  26.0 - 34.0 pg   MCHC 32.7  30.0 - 36.0 g/dL   RDW 40.9  81.1 - 91.4 %   Platelets 183  150 - 400 K/uL  BASIC METABOLIC PANEL      Result Value Range   Sodium 138  135 - 145 mEq/L   Potassium 3.7  3.5 - 5.1 mEq/L   Chloride 98  96 - 112 mEq/L   CO2 31  19 - 32 mEq/L   Glucose, Bld 88  70 - 99 mg/dL   BUN 10  6 - 23 mg/dL   Creatinine, Ser 7.82  0.50 - 1.10 mg/dL   Calcium 9.0  8.4 - 95.6 mg/dL   GFR calc non Af Amer 87 (*) >90 mL/min   GFR calc Af Amer >90  >90 mL/min  D-DIMER, QUANTITATIVE      Result Value Range   D-Dimer, Quant 1.15 (*) 0.00 - 0.48 ug/mL-FEU  POCT I-STAT TROPONIN I  Result Value Range   Troponin i, poc 0.00  0.00 - 0.08 ng/mL   Comment 3            Dg Chest 2 View  08/31/2012   *RADIOLOGY REPORT*  Clinical Data: Chest pain  CHEST - 2 VIEW  Comparison: None.  Findings: Mild cardiomegaly.  Mediastinal contours within normal limits.  There is pulmonary vascular congestion without overt edema.  No focal airspace consolidation.  No pneumothorax or pleural effusion.  There is an 8 mm nodular opacity in the right upper lung projecting just posterior to the mid right clavicle.  IMPRESSION:  1.  Cardiomegaly and mild pulmonary vascular congestion without overt edema.  2.  Probable 8 mm pulmonary nodule in the right lung apex. Recommend further evaluation with non emergent CT scan of the chest.   Original Report Authenticated By: Malachy Moan, M.D.      Fayrene Helper, PA-C 08/31/12 1811  Fayrene Helper, PA-C 08/31/12 2216

## 2012-08-31 NOTE — Progress Notes (Signed)
Right lower extremity venous duplex completed.  Right:  No evidence of DVT or Baker's cyst.  There appears to be superficial thrombus in the greater saphenous vein from the proximal calf to the distal thigh.  Left:  Negative for DVT in the common femoral vein.

## 2012-09-02 NOTE — ED Provider Notes (Signed)
Medical screening examination/treatment/procedure(s) were performed by non-physician practitioner and as supervising physician I was immediately available for consultation/collaboration.  Thomasenia Dowse, MD 09/02/12 1700 

## 2012-09-04 NOTE — ED Provider Notes (Signed)
Medical screening examination/treatment/procedure(s) were performed by non-physician practitioner and as supervising physician I was immediately available for consultation/collaboration.    Gilda Crease, MD 09/04/12 [phone]

## 2012-09-06 ENCOUNTER — Other Ambulatory Visit: Payer: Self-pay | Admitting: *Deleted

## 2012-09-06 ENCOUNTER — Ambulatory Visit (INDEPENDENT_AMBULATORY_CARE_PROVIDER_SITE_OTHER): Payer: Medicare Other | Admitting: Internal Medicine

## 2012-09-06 ENCOUNTER — Encounter (HOSPITAL_COMMUNITY): Payer: Self-pay | Admitting: Internal Medicine

## 2012-09-06 ENCOUNTER — Encounter: Payer: Self-pay | Admitting: Internal Medicine

## 2012-09-06 VITALS — BP 152/92 | HR 64 | Ht 68.0 in | Wt 227.6 lb

## 2012-09-06 DIAGNOSIS — R0600 Dyspnea, unspecified: Secondary | ICD-10-CM | POA: Insufficient documentation

## 2012-09-06 DIAGNOSIS — R079 Chest pain, unspecified: Secondary | ICD-10-CM

## 2012-09-06 DIAGNOSIS — I10 Essential (primary) hypertension: Secondary | ICD-10-CM | POA: Insufficient documentation

## 2012-09-06 DIAGNOSIS — E669 Obesity, unspecified: Secondary | ICD-10-CM

## 2012-09-06 DIAGNOSIS — R0609 Other forms of dyspnea: Secondary | ICD-10-CM

## 2012-09-06 DIAGNOSIS — I8000 Phlebitis and thrombophlebitis of superficial vessels of unspecified lower extremity: Secondary | ICD-10-CM

## 2012-09-06 DIAGNOSIS — I8001 Phlebitis and thrombophlebitis of superficial vessels of right lower extremity: Secondary | ICD-10-CM | POA: Insufficient documentation

## 2012-09-06 NOTE — Patient Instructions (Addendum)
Your physician has requested that you have a lexiscan myoview. For further information please visit [email or web address] Please follow instruction sheet, as given.  Your physician recommends that you schedule a follow-up appointment in: 1 week after your test.

## 2012-09-06 NOTE — Progress Notes (Signed)
OFFICE NOTE  Chief Complaint:  Chest pain, dyspnea on exertion, superficial phlebitis  Primary Care Physician: Remus Loffler, PA-C  HPI:  Kristin Wyatt is a pleasant 72 year old female who recently was seen in the emergency room for acute onset chest pain. She described an episode of sudden onset chest tightness and shortness of breath which last lasted approximately 15 minutes. This is different than episodes of panic attacks she's had in the past. She said there was extreme nausea without vomiting or diuresis that morning. The nausea resolved slowly over time the chest discomfort went away without any medications. The chest pain was substernal and felt squeezing in nature.  It did not radiate to her jaw arm or back. She rated 10 out of 10 intensity. Of note, her family history is significant for cardiac disease with 2 sisters who have heart disease and/or died of heart attacks, mother with heart disease, father who had an MI and history of DVT/PE.  Her personal risk factors include age, obesity, hyperlipidemia, and hypertension. In the emergency department she ruled out for MI had a CT angiogram was performed which was negative for PE, however she did have an elevated d-dimer. A right lower extremity Doppler did demonstrate superficial phlebitis of the greater saphenous vein. She also reports recent swelling in both the left and right legs.    PMHx:  Past Medical History  Diagnosis Date  . Hypertension   . Anxiety   . Cancer     scalp  . Arthritis     knees    Past Surgical History  Procedure Laterality Date  . Abdominal hysterectomy    . Skin cancer excision      back of neck-basal cell ca  . Hiatal hernia repair      [facility]  . Carpal tunnel release      bilateral  . Knee arthroscopy      left  . Cataract extraction w/phaco  05/17/2011    Procedure: CATARACT EXTRACTION PHACO AND INTRAOCULAR LENS PLACEMENT (IOC);  Surgeon: Gemma Payor, MD;  Location: [facility];  Service:  Ophthalmology;  Laterality: Right;  CDE: 7.72  . Cataract extraction w/phaco  05/31/2011    Procedure: CATARACT EXTRACTION PHACO AND INTRAOCULAR LENS PLACEMENT (IOC);  Surgeon: Gemma Payor, MD;  Location: [facility];  Service: Ophthalmology;  Laterality: Left;  CDE: 21.01    FAMHx:  Family History  Problem Relation Age of Onset  . Anesthesia problems Neg Hx   . Hypotension Neg Hx   . Malignant hyperthermia Neg Hx   . Pseudochol deficiency Neg Hx   . Heart attack Sister   . Heart attack Father     SOCHx:   reports that she has never smoked. She has never used smokeless tobacco. She reports that she does not drink alcohol or use illicit drugs.  ALLERGIES:  Allergies  Allergen Reactions  . Morphine And Related Other (See Comments)    Reaction after hysterectomy - not sure what the reaction was    ROS: A comprehensive review of systems was negative except for: Constitutional: positive for fatigue Cardiovascular: positive for chest pain and dyspnea Behavioral/Psych: positive for anxiety and obesity  HOME MEDS: Current Outpatient Prescriptions  Medication Sig Dispense Refill  . acetaminophen (TYLENOL) 500 MG tablet Take 1,000 mg by mouth daily as needed for pain.       Marland Kitchen ALPRAZolam (XANAX) 0.5 MG tablet Take 0.5 mg by mouth daily as needed for anxiety.       Marland Kitchen  diltiazem (CARDIZEM CD) 120 MG 24 hr capsule Take 120 mg by mouth daily.      . hydrochlorothiazide (HYDRODIURIL) 25 MG tablet Take 25 mg by mouth daily.       No current facility-administered medications for this visit.    LABS/IMAGING: No results found for this or any previous visit (from the past 48 hour(s)). No results found.  VITALS: BP 152/92  Pulse 64  Ht 5\' 8"  (1.727 m)  Wt 227 lb 9.6 oz (103.239 kg)  BMI 34.61 kg/m2  EXAM: General appearance: alert, no distress and moderately obese Neck: no adenopathy, no carotid bruit, no JVD, supple, symmetrical, trachea midline and thyroid not enlarged, symmetric, no  tenderness/mass/nodules Lungs: clear to auscultation bilaterally Heart: regular rate and rhythm, S1, S2 normal, no murmur, click, rub or gallop Abdomen: soft, non-tender; bowel sounds normal; no masses,  no organomegaly Extremities: edema 1+ bilateral, varicose veins noted and spider veins, along with some superficial phlebitis below the right knee, CEAP 1,2,3 bilateral venous disease Pulses: 2+ and symmetric Skin: Skin color, texture, turgor normal. No rashes or lesions Neurologic: Grossly normal  EKG: Normal sinus rhythm at 64 with a PVC  ASSESSMENT: 1. Chest pain 2. Dyspnea on exertion 3. Hypertension 4. Obesity 5. Superficial phlebitis 6. Strong family history of premature coronary disease  PLAN: 1.   [name] had an episode of substernal chest pressure which sounds anginal in nature. There is a strong family history of cardiovascular disease. She has multiple cardiovascular risk factors. I like for her to undergo nuclear stress testing due to her inability to exercise on the treadmill due to shortness of breath. In addition, she has superficial phlebitis. There are sequelae of chronic venous insufficiency in both legs. I've asked her to start taking daily low-dose aspirin. Once her phlebitis resolves, I would recommend bilateral venous insufficiency studies, she may be candidate for endovenous radiofrequency ablation.  We'll see her back in the office in a few weeks to discuss results of her stress test.  Thank you for the kind referral.  Kristin Nose, MD, [facility] Attending Cardiologist [facility]  HILTY,Kenneth C 09/06/2012, 2:02 PM

## 2012-09-13 ENCOUNTER — Encounter: Payer: Self-pay | Admitting: Internal Medicine

## 2012-09-14 ENCOUNTER — Ambulatory Visit (HOSPITAL_COMMUNITY)
Admission: RE | Admit: 2012-09-14 | Discharge: 2012-09-14 | Disposition: A | Discharge status: Home / Self Care | Payer: Medicare Other | Source: Ambulatory Visit | Attending: Cardiovascular Disease | Admitting: Cardiovascular Disease

## 2012-09-14 DIAGNOSIS — R0989 Other specified symptoms and signs involving the circulatory and respiratory systems: Secondary | ICD-10-CM | POA: Insufficient documentation

## 2012-09-14 DIAGNOSIS — E669 Obesity, unspecified: Secondary | ICD-10-CM | POA: Insufficient documentation

## 2012-09-14 DIAGNOSIS — R42 Dizziness and giddiness: Secondary | ICD-10-CM | POA: Insufficient documentation

## 2012-09-14 DIAGNOSIS — R0600 Dyspnea, unspecified: Secondary | ICD-10-CM

## 2012-09-14 DIAGNOSIS — Z8249 Family history of ischemic heart disease and other diseases of the circulatory system: Secondary | ICD-10-CM | POA: Insufficient documentation

## 2012-09-14 DIAGNOSIS — R0609 Other forms of dyspnea: Secondary | ICD-10-CM | POA: Insufficient documentation

## 2012-09-14 DIAGNOSIS — R079 Chest pain, unspecified: Secondary | ICD-10-CM | POA: Insufficient documentation

## 2012-09-14 DIAGNOSIS — R55 Syncope and collapse: Secondary | ICD-10-CM | POA: Insufficient documentation

## 2012-09-14 DIAGNOSIS — I10 Essential (primary) hypertension: Secondary | ICD-10-CM | POA: Insufficient documentation

## 2012-09-14 MED ORDER — REGADENOSON 0.4 MG/5ML IV SOLN
0.4000 mg | Freq: Once | INTRAVENOUS | Status: AC
  Administered 2012-09-14: 0.4 mg via INTRAVENOUS

## 2012-09-14 MED ORDER — TECHNETIUM TC 99M SESTAMIBI GENERIC - CARDIOLITE
30.2000 | Freq: Once | INTRAVENOUS | Status: AC | PRN
  Administered 2012-09-14: 30.2 via INTRAVENOUS

## 2012-09-14 MED ORDER — TECHNETIUM TC 99M SESTAMIBI GENERIC - CARDIOLITE
10.8000 | Freq: Once | INTRAVENOUS | Status: AC | PRN
  Administered 2012-09-14: 11 via INTRAVENOUS

## 2012-09-14 NOTE — Procedures (Addendum)
[address] [place]  [facility]ARD[facility]OVASCULAR IMAGING [address] [place] [address] [place] [address] [phone]  Cardiology Nuclear Med Study  Kristin Wyatt is a 72 y.o. female     MRN : [id number]     DOB: 18-Apr-1940  Procedure Date: 09/14/2012  Nuclear Med Background Indication for Stress Test:  Evaluation for Ischemia History:  ENLARGED HEART Cardiac Risk Factors: Family History - CAD, Hypertension and Obesity  Symptoms:  Chest Pain, Dizziness, DOE, Light-Headedness, Near Syncope and SOB   Nuclear Pre-Procedure Caffeine/Decaff Intake:  1:00am NPO After: 11AM   IV Site: R Hand  IV 0.9% NS with Angio Cath:  22g  Chest Size (in):  N/A IV Started by: Emmit Pomfret, RN  Height: 5\' 8"  (1.727 m)  Cup Size: B  BMI:  Body mass index is 34.52 kg/(m^2). Weight:  227 lb (102.967 kg)   Tech Comments:  N/A    Nuclear Med Study 1 or 2 day study: 1 day  Stress Test Type:  Lexiscan  Order Authorizing Provider:  Zoila Shutter, MD   Resting Radionuclide: Technetium 37m Sestamibi  Resting Radionuclide Dose: 10.8 mCi   Stress Radionuclide:  Technetium 60m Sestamibi  Stress Radionuclide Dose: 30.2 mCi           Stress Protocol Rest HR: 62 Stress HR: 72  Rest BP: 110/86 Stress BP: 126/81  Exercise Time (min): n/a METS: n/a          Dose of Adenosine (mg):  n/a Dose of Lexiscan: 0.4 mg  Dose of Atropine (mg): n/a Dose of Dobutamine: n/a mcg/kg/min (at max HR)  Stress Test Technologist: Ernestene Mention, CCT Nuclear Technologist: Gonzella Lex, CNMT   Rest Procedure:  Myocardial perfusion imaging was performed at rest 45 minutes following the intravenous administration of Technetium 31m Sestamibi. Stress Procedure:  The patient received IV Lexiscan 0.4 mg over 15-seconds.  Technetium 21m Sestamibi injected at 30-seconds.  There were no significant changes with Lexiscan.  Quantitative spect images were obtained after a 45 minute delay.  Transient Ischemic Dilatation (Normal <1.22):   1.15 Lung/Heart Ratio (Normal <0.45):  0.34 QGS EDV:  95 ml QGS ESV:  35 ml LV Ejection Fraction: 63%  Signed by       Rest ECG: NSR with non-specific ST-T wave changes  Stress ECG: No significant change from baseline ECG  QPS Raw Data Images:  Normal; no motion artifact; normal heart/lung ratio. Stress Images:  Normal homogeneous uptake in all areas of the myocardium. Rest Images:  Normal homogeneous uptake in all areas of the myocardium. Subtraction (SDS):  No evidence of ischemia.  Impression Exercise Capacity:  Lexiscan with no exercise. BP Response:  Normal blood pressure response. Clinical Symptoms:  No significant symptoms noted. ECG Impression:  No significant ST segment change suggestive of ischemia. Comparison with Prior Nuclear Study: No previous nuclear study performed  Overall Impression:  Normal stress nuclear study.  LV Wall Motion:  NL LV Function; NL Wall Motion   [name], MD  09/14/2012 5:30 PM

## 2012-09-20 ENCOUNTER — Ambulatory Visit (INDEPENDENT_AMBULATORY_CARE_PROVIDER_SITE_OTHER): Payer: Medicare Other | Admitting: Internal Medicine

## 2012-09-20 ENCOUNTER — Encounter: Payer: Self-pay | Admitting: Internal Medicine

## 2012-09-20 VITALS — BP 140/86 | HR 68 | Ht 68.0 in | Wt 227.5 lb

## 2012-09-20 DIAGNOSIS — I83893 Varicose veins of bilateral lower extremities with other complications: Secondary | ICD-10-CM

## 2012-09-20 DIAGNOSIS — E669 Obesity, unspecified: Secondary | ICD-10-CM

## 2012-09-20 DIAGNOSIS — R079 Chest pain, unspecified: Secondary | ICD-10-CM

## 2012-09-20 DIAGNOSIS — I8000 Phlebitis and thrombophlebitis of superficial vessels of unspecified lower extremity: Secondary | ICD-10-CM

## 2012-09-20 DIAGNOSIS — I10 Essential (primary) hypertension: Secondary | ICD-10-CM

## 2012-09-20 DIAGNOSIS — I8001 Phlebitis and thrombophlebitis of superficial vessels of right lower extremity: Secondary | ICD-10-CM

## 2012-09-20 NOTE — Progress Notes (Signed)
OFFICE NOTE  Chief Complaint:  Chest pain, dyspnea on exertion, superficial phlebitis  Primary Care Physician: Kristin Loffler, PA-C  HPI:  Kristin Wyatt is a pleasant 72 year old female who recently was seen in the emergency room for acute onset chest pain. She described an episode of sudden onset chest tightness and shortness of breath which last lasted approximately 15 minutes. This is different than episodes of panic attacks she's had in the past. She said there was extreme nausea without vomiting or diuresis that morning. The nausea resolved slowly over time the chest discomfort went away without any medications. The chest pain was substernal and felt squeezing in nature.  It did not radiate to her jaw arm or back. She rated 10 out of 10 intensity. Of note, her family history is significant for cardiac disease with 2 sisters who have heart disease and/or died of heart attacks, mother with heart disease, father who had an MI and history of DVT/PE.  Her personal risk factors include age, obesity, hyperlipidemia, and hypertension. In the emergency department she ruled out for MI had a CT angiogram was performed which was negative for PE, however she did have an elevated d-dimer. A right lower extremity Doppler did demonstrate superficial phlebitis of the greater saphenous vein. She also reports recent swelling in both the left and right legs.    She returns today to review the results of her stress test. I'm pleased to report the stress test was negative for ischemia in the EF was preserved at 63% with no wall motion abnormalities. Her superficial phlebitis is resolving.  PMHx:  Past Medical History  Diagnosis Date  . Hypertension   . Anxiety   . Cancer     scalp  . Arthritis     knees    Past Surgical History  Procedure Laterality Date  . Abdominal hysterectomy    . Skin cancer excision      back of neck-basal cell ca  . Hiatal hernia repair      [facility]  . Carpal tunnel release      bilateral  . Knee arthroscopy      left  . Cataract extraction w/phaco  05/17/2011    Procedure: CATARACT EXTRACTION PHACO AND INTRAOCULAR LENS PLACEMENT (IOC);  Surgeon: Kristin Payor, MD;  Location: [facility];  Service: Ophthalmology;  Laterality: Right;  CDE: 7.72  . Cataract extraction w/phaco  05/31/2011    Procedure: CATARACT EXTRACTION PHACO AND INTRAOCULAR LENS PLACEMENT (IOC);  Surgeon: Kristin Payor, MD;  Location: [facility];  Service: Ophthalmology;  Laterality: Left;  CDE: 21.01    FAMHx:  Family History  Problem Relation Age of Onset  . Anesthesia problems Neg Hx   . Hypotension Neg Hx   . Malignant hyperthermia Neg Hx   . Pseudochol deficiency Neg Hx   . Heart attack Sister   . Heart attack Father     SOCHx:   reports that she has never smoked. She has never used smokeless tobacco. She reports that she does not drink alcohol or use illicit drugs.  ALLERGIES:  Allergies  Allergen Reactions  . Morphine And Related Other (See Comments)    Reaction after hysterectomy - not sure what the reaction was    ROS: A comprehensive review of systems was negative except for: Constitutional: positive for fatigue Cardiovascular: positive for chest pain and dyspnea Behavioral/Psych: positive for anxiety and obesity  HOME MEDS: Current Outpatient Prescriptions  Medication Sig Dispense Refill  . acetaminophen (TYLENOL) 500 MG tablet  Take 1,000 mg by mouth daily as needed for pain.       Marland Kitchen ALPRAZolam (XANAX) 0.5 MG tablet Take 0.5 mg by mouth daily as needed for anxiety.       Marland Kitchen aspirin 81 MG tablet Take 81 mg by mouth daily.      Marland Kitchen diltiazem (CARDIZEM CD) 120 MG 24 hr capsule Take 120 mg by mouth daily.      . hydrochlorothiazide (HYDRODIURIL) 25 MG tablet Take 25 mg by mouth daily.       No current facility-administered medications for this visit.    LABS/IMAGING: No results found for this or any previous visit (from the past 48 hour(s)). No results found.  VITALS: BP 140/86   Pulse 68  Ht 5\' 8"  (1.727 m)  Wt 227 lb 8 oz (103.193 kg)  BMI 34.6 kg/m2  EXAM: deferred  EKG: deferred  ASSESSMENT: 1. Chest pain 2. Dyspnea on exertion 3. Hypertension 4. Obesity 5. Superficial phlebitis 6. Strong family history of premature coronary disease 7. Bilateral chronic venous reflux disease  PLAN: 1.   Mrs. Bran had an episode of substernal chest pressure which sounded anginal in nature, however her stress test is low risk with no ischemia. There is bilateral venous insufficiency and she has had recent superficial phlebitis with a positive d-dimer. That seems to be resolving. A hospital ultrasound demonstrated no evidence of DVT. And have recommended that she undergo bilateral venous ultrasounds as she may have significant superficial varicose veins which could be amenable to compression stockings and/or venous ablation (an office-based procedure which I perform). I gave her literature on that subject today and she will call back the office to schedule venous Dopplers if she wishes to go forward.   Thank you again or the kind referral.  Kristin Nose, MD, [facility] Attending Cardiologist [facility]  Kristin Wyatt C 09/20/2012, 12:31 PM

## 2012-09-20 NOTE — Patient Instructions (Signed)
Your physician recommends that you schedule a follow-up appointment as needed  

## 2013-09-22 IMAGING — CT CT ANGIO CHEST
2 of 6 series · 19 of 46 positions shown · IV contrast (omnipaque)
Comparison: 08/31/2012

CLINICAL DATA: Chest pain with short of breath.  Rule out pulmonary
embolism

CT ANGIOGRAPHY CHEST
TECHNIQUE: Multidetector CT imaging of the chest using the
standard protocol during bolus administration of intravenous
contrast. Multiplanar reconstructed images including MIPs were
obtained and reviewed to evaluate the vascular anatomy.
Contrast: 100mL OMNIPAQUE IOHEXOL 350 MG/ML SOLN

[Series 6: pulm embolism 1.0 b25f thin · axial · 0.74mm/px · z∈[-244,-26]mm · 16 of 240 slices shown]
[im 11/240  lung]
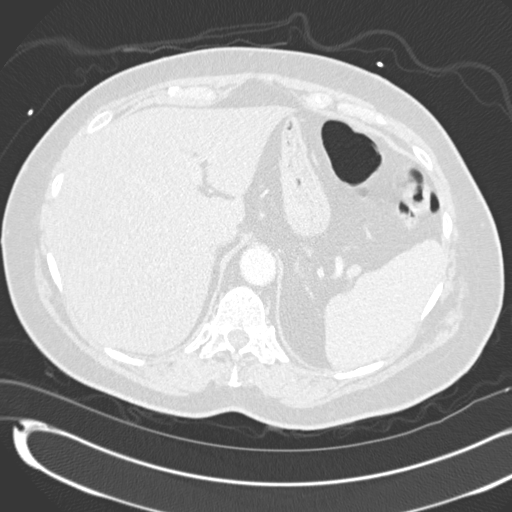
[im 32/240  soft-tissue]
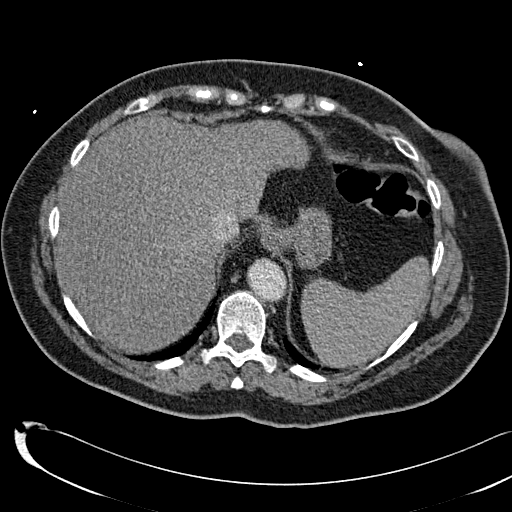
[im 42/240  lung]
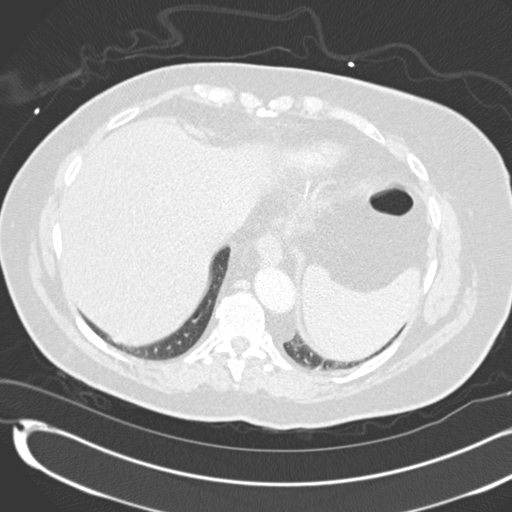
[im 52/240  soft-tissue]
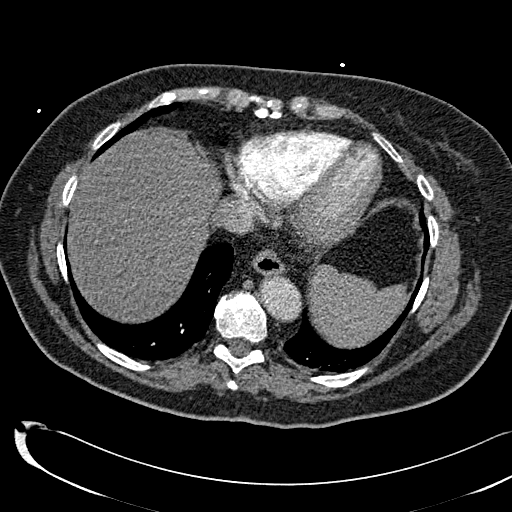
[im 73/240  lung]
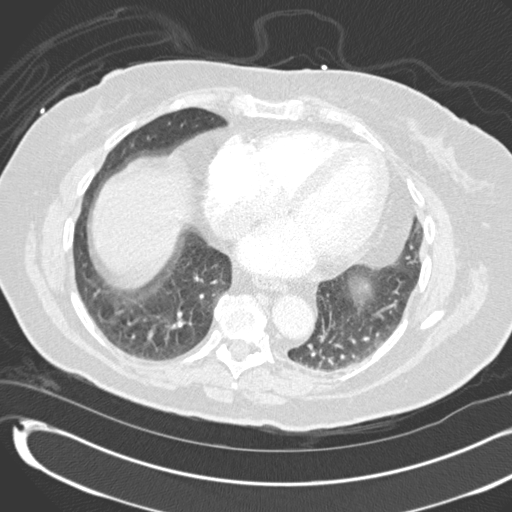
[im 84/240  soft-tissue]
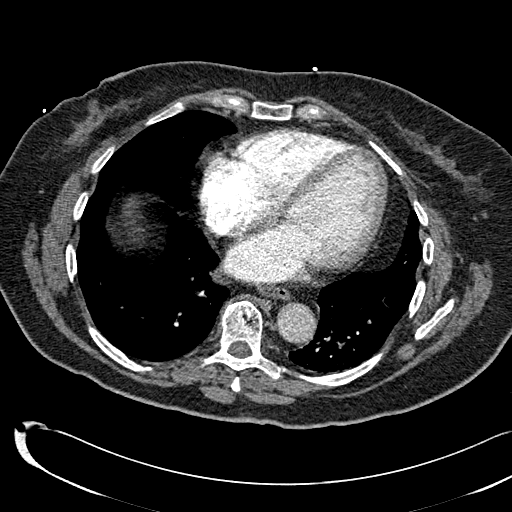
[im 94/240  lung]
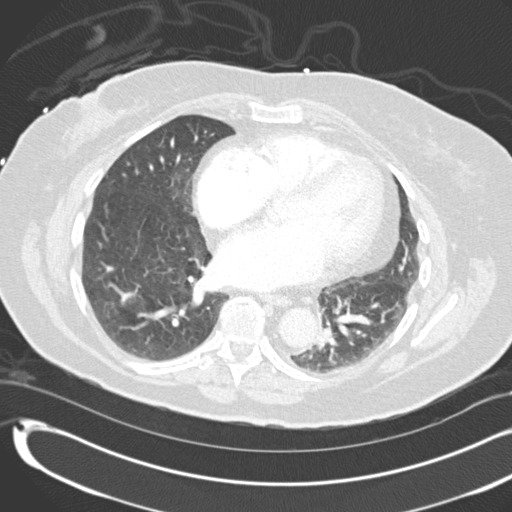
[im 115/240  soft-tissue]
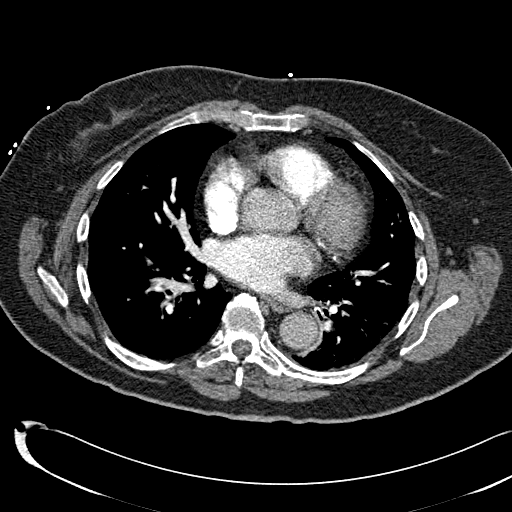
[im 125/240  lung]
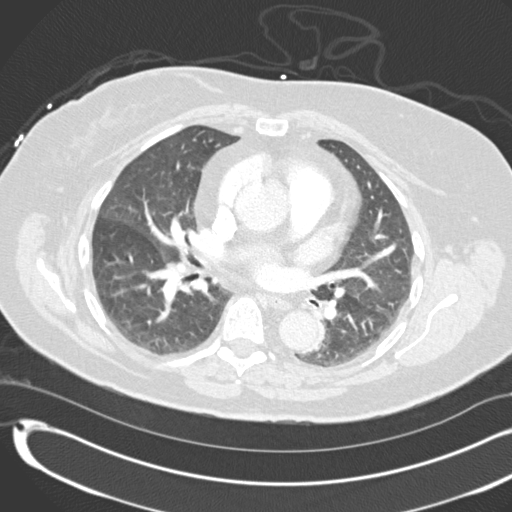
[im 146/240  soft-tissue]
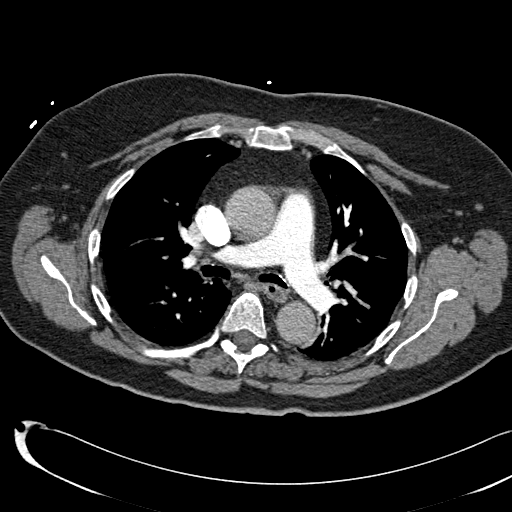
[im 156/240  lung]
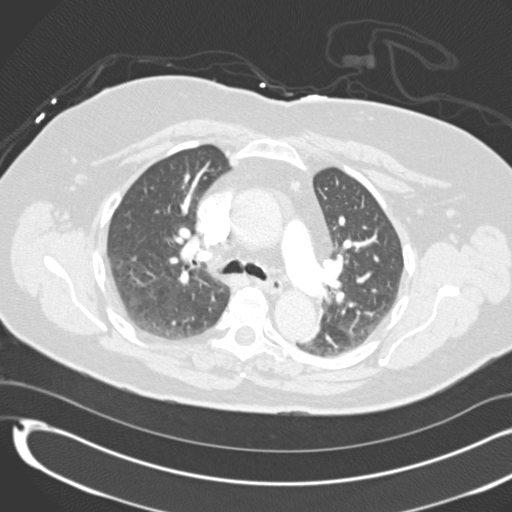
[im 167/240  soft-tissue]
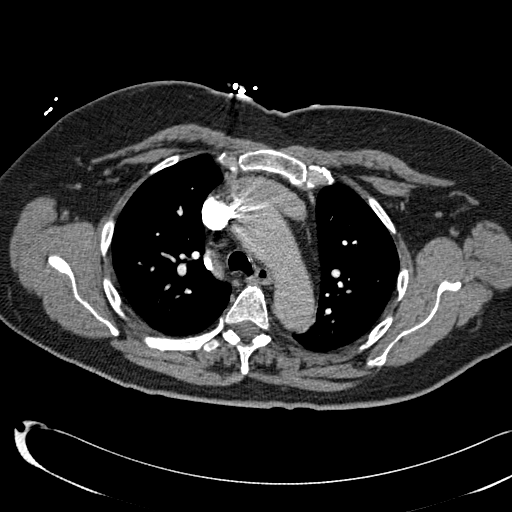
[im 188/240  lung]
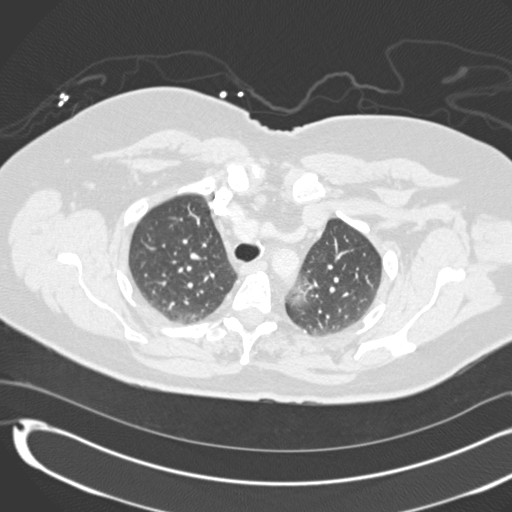
[im 198/240  soft-tissue]
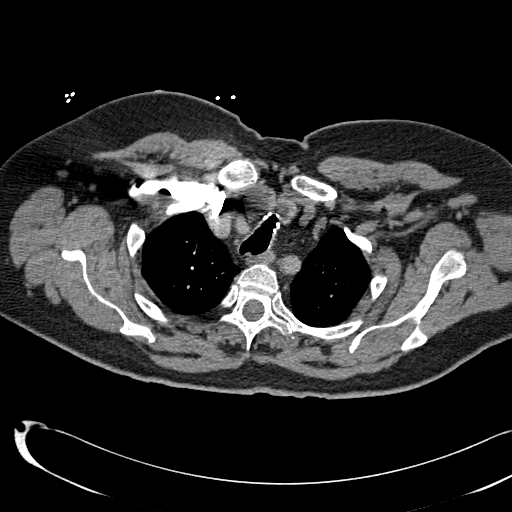
[im 208/240  lung]
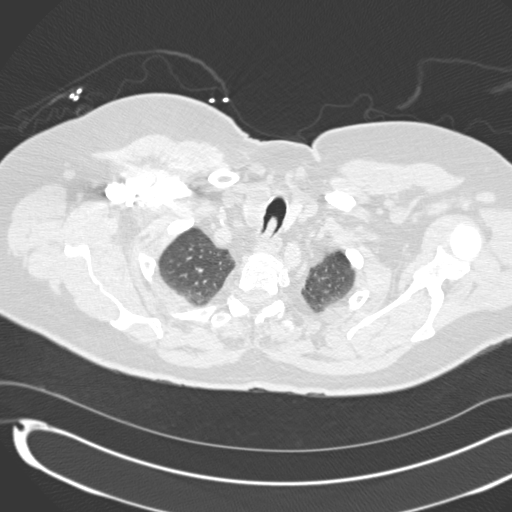
[im 229/240  soft-tissue]
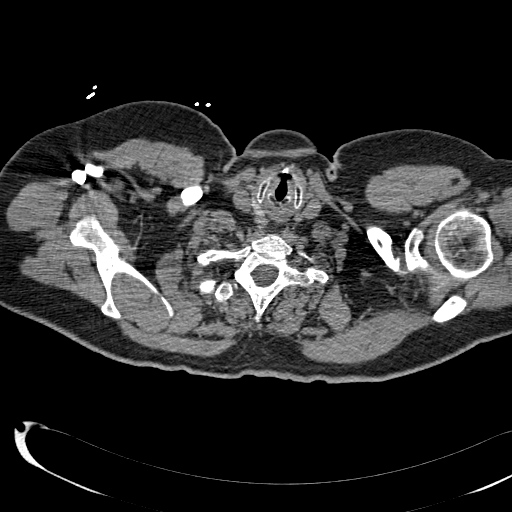

[Series 602: cor · coronal · 0.74mm/px · 3 of 108 slices shown]
[im 27/108  soft-tissue]
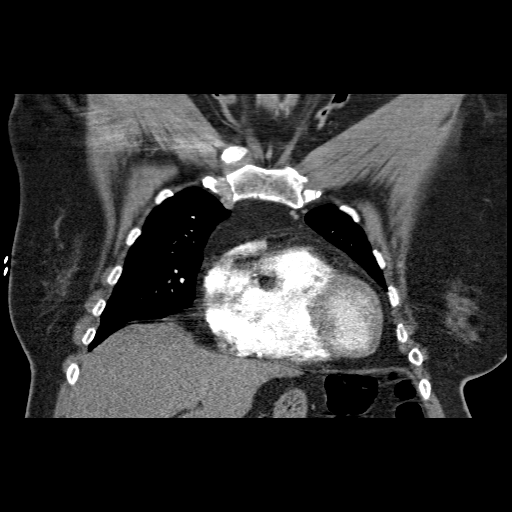
[im 54/108  soft-tissue]
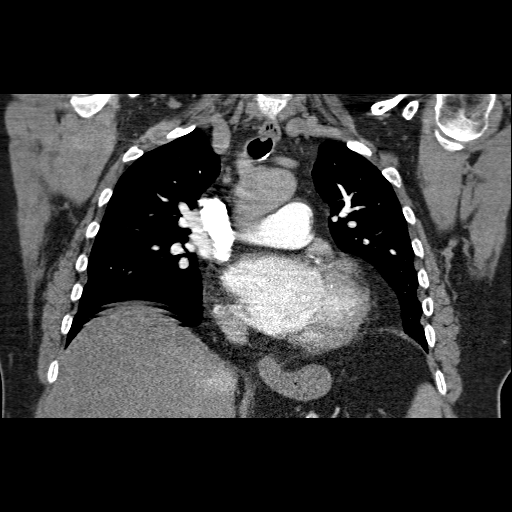
[im 81/108  soft-tissue]
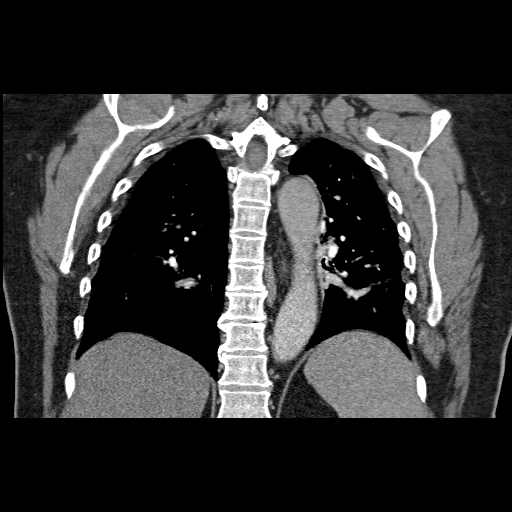

[19 of 46 positions shown; findings below may reference images not displayed]

FINDINGS: Negative for pulmonary embolism.  Negative for aortic
aneurysm or dissection.

Negative for mass or adenopathy.  No right apical lung nodule is
present as questioned on the chest x-ray.

Mild left lower lobe atelectasis adjacent to the thoracic aorta.
No pleural effusion.  Negative for pneumonia.
IMPRESSION: Negative for pulmonary embolism.

Negative for lung nodule.

No acute abnormality.

## 2016-01-08 ENCOUNTER — Telehealth: Payer: Self-pay | Admitting: Physician Assistant

## 2016-01-08 NOTE — Telephone Encounter (Signed)
It is okay to refill these.  Can you get the update from [place]?  Her chart is showing diltiazem which we Dc'd.

## 2016-01-08 NOTE — Telephone Encounter (Signed)
noted 

## 2016-01-08 NOTE — Telephone Encounter (Signed)
Left message for patient to call us with medications and dosages.

## 2016-01-23 ENCOUNTER — Encounter: Payer: Self-pay | Admitting: Physician Assistant

## 2016-01-23 ENCOUNTER — Ambulatory Visit (INDEPENDENT_AMBULATORY_CARE_PROVIDER_SITE_OTHER): Payer: Medicare Other | Admitting: Physician Assistant

## 2016-01-23 ENCOUNTER — Encounter (INDEPENDENT_AMBULATORY_CARE_PROVIDER_SITE_OTHER): Payer: Self-pay

## 2016-01-23 VITALS — BP 137/89 | HR 83 | Temp 97.3°F | Ht 68.0 in | Wt 236.0 lb

## 2016-01-23 DIAGNOSIS — Z23 Encounter for immunization: Secondary | ICD-10-CM | POA: Diagnosis not present

## 2016-01-23 DIAGNOSIS — F411 Generalized anxiety disorder: Secondary | ICD-10-CM | POA: Diagnosis not present

## 2016-01-23 DIAGNOSIS — I10 Essential (primary) hypertension: Secondary | ICD-10-CM | POA: Diagnosis not present

## 2016-01-23 MED ORDER — LOSARTAN POTASSIUM 100 MG PO TABS: 100.0000 mg | ORAL_TABLET | Freq: Every day | ORAL | 3 refills | Status: DC

## 2016-01-23 MED ORDER — HYDROCHLOROTHIAZIDE 25 MG PO TABS: 25.0000 mg | ORAL_TABLET | Freq: Every day | ORAL | 3 refills | Status: DC

## 2016-01-23 MED ORDER — ALPRAZOLAM 0.5 MG PO TABS: 0.5000 mg | ORAL_TABLET | Freq: Two times a day (BID) | ORAL | 1 refills | Status: DC | PRN

## 2016-01-23 NOTE — Patient Instructions (Addendum)
Agoraphobia Introduction Agoraphobia is a mental health disorder. It is a type of anxiety or fear. People with agoraphobia fear public places where they may be trapped, helpless, or embarrassed in the event of a panic attack or a loss of control. They often start to avoid the feared situations or insist that another person go with them. Agoraphobia may interfere with normal daily activities and personal relationships. People with severe agoraphobia may become completely homebound and dependent on others for grocery shopping and other errands. Agoraphobia usually begins before age 64, but it can start in the older adult years. People with agoraphobia are at risk for other anxiety disorders, depression, and substance abuse. What are the causes? It is not known exactly what causes agoraphobia. What increases the risk? Agoraphobia is more common in women. People who have panic disorder or have family members with agoraphobia are at higher risk of developing agoraphobia. What are the signs or symptoms? You may have agoraphobia if you have the following symptoms for 6 months or longer:  Intense fear about two or more of the following:  Using public transportation, such as cars, buses, planes, trains, or ships.  Being in open spaces, such as parking lots, shopping malls, or bridges.  Being in enclosed spaces, such as shops, theaters, or elevators.  Standing in line or being in a crowd.  Being outside the home alone.  Fear that is due to thoughts of being unable to escape or get help if certain events occur. The feared event may be a panic attack or panic-like symptoms, such as a racing heart, dizziness, and trouble breathing. In older people, the feared event may be a fall or loss of bowel control.  Reacting to feared situations by:  Avoiding them.  Requiring the presence of a companion.  Enduring them with intense fear or anxiety.  Fear or anxiety that is out of proportion to the actual  danger that is posed by the event and the situation. How is this diagnosed? Agoraphobia may be diagnosed by your health care provider. You will be asked questions about your fears and how they have affected you. You may be asked about your medical history and your use of medicines, alcohol, or drugs. Your health care provider may do a physical exam and order lab tests or other studies to rule out a medical condition. You may also be referred to a mental health specialist. How is this treated? Treatment usually includes a combination of counseling and medicines.  Counseling or talk therapy. Talk therapy is provided by mental health specialists. The following forms of talk therapy can be especially helpful:  Cognitive therapy. Cognitive therapy helps you to recognize and change unrealistic thoughts and beliefs that contribute to your fears.  Exposure therapy. Exposure therapy helps you to face and overcome your fears in a relaxed state and a safe environment.  Medicines. The following types of medicines may be helpful:  Antidepressants. Antidepressants are believed to affect certain chemicals in your brain. They can decrease general levels of anxiety and can help to prevent panic attacks.  Benzodiazepines. These medicines block feelings of anxiety and panic. They are very effective and act more quickly than antidepressants, but they are highly addictive. These medicines are recommended only for short-term use.  Beta blockers. Beta blockers can reduce physical symptoms of anxiety, such as a racing heart, sweating, and tremor. They may help you to feel less tense and anxious. Follow these instructions at home:  Keep all follow-up visits as directed  by your health care provider. This is important.  Take all medicines only as directed by your health care provider.  Try to exercise, eat a healthy diet, and get plenty of sleep.  Do not drink alcohol.  Do not use illegal drugs. Where to find  more information: For more information, visit the website of the Anxiety and Depression Association of [place] (ADAA): https://www.clark.net/ Contact a health care provider if:  Your fear or anxiety gets worse.  You have new fears or anxieties. Get help right away if:  You have serious thoughts about hurting yourself or someone else.  You have trouble breathing or have chest pain. This information is not intended to replace advice given to you by your health care provider. Make sure you discuss any questions you have with your health care provider. Document Released: 07/15/2010 Document Revised: 07/31/2015 Document Reviewed: 06/25/2013  2017 Elsevier ag

## 2016-01-25 NOTE — Progress Notes (Signed)
BP 137/89   Pulse 83   Temp 97.3 F (36.3 C) (Oral)   Ht 5\' 8"  (1.727 m)   Wt 236 lb (107 kg)   BMI 35.88 kg/m    Subjective:    Patient ID: Kristin Wyatt, female    DOB: 06-22-1940, 75 y.o.   MRN: [id number]  Kristin Wyatt is a 75 y.o. female presenting on 01/23/2016 for Follow-up; Hypertension; and Medication Refill  HPI Patient here to be established as new patient at [facility].  This patient is known to me from [facility]. This patient comes in for periodic recheck on medications and conditions. All medications are reviewed today. There are no reports of any problems with the medications. All of the medical conditions are reviewed and updated.  Lab work is reviewed and will be ordered as medically necessary. There are no new problems reported with today's visit.   Past Medical History:  Diagnosis Date  . Anxiety   . Arthritis    knees  . Cancer (HCC)    scalp  . Hypertension    Relevant past medical, surgical, family and social history reviewed and updated as indicated. Interim medical history since our last visit reviewed. Allergies and medications reviewed and updated.   Data reviewed from any sources in EPIC.  Review of Systems  Constitutional: Negative.  Negative for activity change, fatigue and fever.  HENT: Negative.   Eyes: Negative.   Respiratory: Negative.  Negative for cough.   Cardiovascular: Negative.  Negative for chest pain.  Gastrointestinal: Negative.  Negative for abdominal pain.  Endocrine: Negative.   Genitourinary: Negative.  Negative for dysuria.  Musculoskeletal: Negative.   Skin: Negative.   Neurological: Negative.      Social History   Social History  . Marital status: Widowed    Spouse name: N/A  . Number of children: 3  . Years of education: N/A   Occupational History  . Not on file.   Social History Main Topics  . Smoking status: Never Smoker  . Smokeless tobacco: Never Used  .  Alcohol use No  . Drug use: No  . Sexual activity: Yes    Birth control/ protection: Surgical   Other Topics Concern  . Not on file   Social History Narrative  . No narrative on file    Past Surgical History:  Procedure Laterality Date  . ABDOMINAL HYSTERECTOMY    . CARPAL TUNNEL RELEASE     bilateral  . CATARACT EXTRACTION W/PHACO  05/17/2011   Procedure: CATARACT EXTRACTION PHACO AND INTRAOCULAR LENS PLACEMENT (IOC);  Surgeon: Tonny Branch, MD;  Location: [facility];  Service: Ophthalmology;  Laterality: Right;  CDE: 7.72  . CATARACT EXTRACTION W/PHACO  05/31/2011   Procedure: CATARACT EXTRACTION PHACO AND INTRAOCULAR LENS PLACEMENT (IOC);  Surgeon: Tonny Branch, MD;  Location: [facility];  Service: Ophthalmology;  Laterality: Left;  CDE: 21.01  . HIATAL HERNIA REPAIR     [facility]  . KNEE ARTHROSCOPY     left  . SKIN CANCER EXCISION     back of neck-basal cell ca    Family History  Problem Relation Age of Onset  . Heart attack Father   . Heart attack Sister   . Anesthesia problems Neg Hx   . Hypotension Neg Hx   . Malignant hyperthermia Neg Hx   . Pseudochol deficiency Neg Hx       Medication List       Accurate as of 01/23/16 11:59  PM. Always use your most recent med list.          ALPRAZolam 0.5 MG tablet Commonly known as:  XANAX Take 1 tablet (0.5 mg total) by mouth 2 (two) times daily as needed for anxiety.   hydrochlorothiazide 25 MG tablet Commonly known as:  HYDRODIURIL Take 1 tablet (25 mg total) by mouth daily.   losartan 100 MG tablet Commonly known as:  COZAAR Take 1 tablet (100 mg total) by mouth daily.          Objective:    BP 137/89   Pulse 83   Temp 97.3 F (36.3 C) (Oral)   Ht 5\' 8"  (1.727 m)   Wt 236 lb (107 kg)   BMI 35.88 kg/m   Allergies  Allergen Reactions  . Morphine And Related Other (See Comments)    Reaction after hysterectomy - not sure what the reaction was   Wt Readings from Last 3 Encounters:  01/23/16 236 lb (107 kg)    09/20/12 227 lb 8 oz (103.2 kg)  09/14/12 227 lb (103 kg)    Physical Exam  Constitutional: She is oriented to person, place, and time. She appears well-developed and well-nourished.  HENT:  Head: Normocephalic and atraumatic.  Eyes: Conjunctivae and EOM are normal. Pupils are equal, round, and reactive to light.  Neck: Normal range of motion. Neck supple.  Cardiovascular: Normal rate, regular rhythm, normal heart sounds and intact distal pulses.   Pulmonary/Chest: Effort normal and breath sounds normal.  Abdominal: Soft. Bowel sounds are normal.  Neurological: She is alert and oriented to person, place, and time. She has normal reflexes.  Skin: Skin is warm and dry. No rash noted.  Psychiatric: She has a normal mood and affect. Her behavior is normal. Judgment and thought content normal.  Nursing note and vitals reviewed.       Assessment & Plan:   1. Essential hypertension - losartan (COZAAR) 100 MG tablet; Take 1 tablet (100 mg total) by mouth daily.  Dispense: 90 tablet; Refill: 3 - hydrochlorothiazide (HYDRODIURIL) 25 MG tablet; Take 1 tablet (25 mg total) by mouth daily.  Dispense: 90 tablet; Refill: 3  2. Generalized anxiety disorder - ALPRAZolam (XANAX) 0.5 MG tablet; Take 1 tablet (0.5 mg total) by mouth 2 (two) times daily as needed for anxiety.  Dispense: 30 tablet; Refill: 1  3. Morbid obesity ([place])  4. Encounter for immunization - losartan (COZAAR) 100 MG tablet; Take 1 tablet (100 mg total) by mouth daily.  Dispense: 90 tablet; Refill: 3 - hydrochlorothiazide (HYDRODIURIL) 25 MG tablet; Take 1 tablet (25 mg total) by mouth daily.  Dispense: 90 tablet; Refill: 3 - ALPRAZolam (XANAX) 0.5 MG tablet; Take 1 tablet (0.5 mg total) by mouth 2 (two) times daily as needed for anxiety.  Dispense: 30 tablet; Refill: 1 - Flu vaccine HIGH DOSE PF   Continue all other maintenance medications as listed above. Educational handout given for agoraphobia  Follow up plan: Return  in about 1 year (around 01/22/2017).  Terald Sleeper PA-C [facility] [address]  [place], [place] [address] [phone]   01/25/2016, 7:00 PM

## 2017-02-01 ENCOUNTER — Encounter: Payer: Self-pay | Admitting: Physician Assistant

## 2017-02-01 ENCOUNTER — Ambulatory Visit (INDEPENDENT_AMBULATORY_CARE_PROVIDER_SITE_OTHER): Payer: Medicare Other | Admitting: Physician Assistant

## 2017-02-01 VITALS — BP 137/85 | HR 90 | Temp 97.7°F | Ht 68.0 in | Wt 237.0 lb

## 2017-02-01 DIAGNOSIS — F419 Anxiety disorder, unspecified: Secondary | ICD-10-CM | POA: Diagnosis not present

## 2017-02-01 DIAGNOSIS — I10 Essential (primary) hypertension: Secondary | ICD-10-CM | POA: Diagnosis not present

## 2017-02-01 DIAGNOSIS — L719 Rosacea, unspecified: Secondary | ICD-10-CM | POA: Diagnosis not present

## 2017-02-01 DIAGNOSIS — F411 Generalized anxiety disorder: Secondary | ICD-10-CM

## 2017-02-01 DIAGNOSIS — Z0000 Encounter for general adult medical examination without abnormal findings: Secondary | ICD-10-CM

## 2017-02-01 MED ORDER — ALPRAZOLAM 0.5 MG PO TABS: 0.5000 mg | ORAL_TABLET | Freq: Two times a day (BID) | ORAL | 1 refills | Status: DC | PRN

## 2017-02-01 MED ORDER — HYDROCHLOROTHIAZIDE 25 MG PO TABS: 25.0000 mg | ORAL_TABLET | Freq: Every day | ORAL | 3 refills | Status: DC

## 2017-02-01 MED ORDER — METRONIDAZOLE 1 % EX GEL: Freq: Every day | CUTANEOUS | 6 refills | Status: DC

## 2017-02-01 MED ORDER — LOSARTAN POTASSIUM 100 MG PO TABS: 100.0000 mg | ORAL_TABLET | Freq: Every day | ORAL | 3 refills | Status: DC

## 2017-02-01 NOTE — Progress Notes (Signed)
BP 137/85   Pulse 90   Temp 97.7 F (36.5 C) (Oral)   Ht '5\' 8"'  (1.727 m)   Wt 237 lb (107.5 kg)   BMI 36.04 kg/m    Subjective:    Patient ID: Kristin Wyatt, female    DOB: [birth year], 76 y.o.   MRN: [id number]  HPI: Kristin Wyatt is a 76 y.o. female presenting on 02/01/2017 for Follow-up and Medication Refill  This patient comes in for periodic recheck on medications and conditions including hypertension, anxiety next week and the need for annual labs.  She is doing well with her medications.  She does have a new complaint of acne rosacea on her cheeks.  It does run in her family.  She has never tried any prescription medications for it.  All medications are reviewed today. There are no reports of any problems with the medications. All of the medical conditions are reviewed and updated.  Lab work is reviewed and will be ordered as medically necessary. There are no new problems reported with today's visit.   Relevant past medical, surgical, family and social history reviewed and updated as indicated. Allergies and medications reviewed and updated.  Past Medical History:  Diagnosis Date  . Anxiety   . Arthritis    knees  . Cancer (HCC)    scalp  . Hypertension     Past Surgical History:  Procedure Laterality Date  . ABDOMINAL HYSTERECTOMY    . CARPAL TUNNEL RELEASE     bilateral  . CATARACT EXTRACTION W/PHACO  05/17/2011   Procedure: CATARACT EXTRACTION PHACO AND INTRAOCULAR LENS PLACEMENT (IOC);  Surgeon: Tonny Branch, MD;  Location: [facility];  Service: Ophthalmology;  Laterality: Right;  CDE: 7.72  . CATARACT EXTRACTION W/PHACO  05/31/2011   Procedure: CATARACT EXTRACTION PHACO AND INTRAOCULAR LENS PLACEMENT (IOC);  Surgeon: Tonny Branch, MD;  Location: [facility];  Service: Ophthalmology;  Laterality: Left;  CDE: 21.01  . HIATAL HERNIA REPAIR     [facility]  . KNEE ARTHROSCOPY     left  . SKIN CANCER EXCISION     back of neck-basal cell ca    Review of Systems    Constitutional: Negative.  Negative for activity change, fatigue and fever.  HENT: Negative.   Eyes: Negative.   Respiratory: Negative.  Negative for cough.   Cardiovascular: Negative.  Negative for chest pain.  Gastrointestinal: Negative.  Negative for abdominal pain.  Endocrine: Negative.   Genitourinary: Negative.  Negative for dysuria.  Musculoskeletal: Positive for arthralgias.  Skin: Positive for color change and rash.  Neurological: Negative.     Allergies as of 02/01/2017      Reactions   Morphine And Related Other (See Comments)   Reaction after hysterectomy - not sure what the reaction was      Medication List        Accurate as of 02/01/17 10:07 PM. Always use your most recent med list.          ALPRAZolam 0.5 MG tablet Commonly known as:  XANAX Take 1 tablet (0.5 mg total) by mouth 2 (two) times daily as needed for anxiety.   hydrochlorothiazide 25 MG tablet Commonly known as:  HYDRODIURIL Take 1 tablet (25 mg total) by mouth daily.   losartan 100 MG tablet Commonly known as:  COZAAR Take 1 tablet (100 mg total) by mouth daily.   metroNIDAZOLE 1 % gel Commonly known as:  METROGEL Apply topically daily.  Objective:    BP 137/85   Pulse 90   Temp 97.7 F (36.5 C) (Oral)   Ht '5\' 8"'  (1.727 m)   Wt 237 lb (107.5 kg)   BMI 36.04 kg/m   Allergies  Allergen Reactions  . Morphine And Related Other (See Comments)    Reaction after hysterectomy - not sure what the reaction was    Physical Exam  Constitutional: She is oriented to person, place, and time. She appears well-developed and well-nourished.  HENT:  Head: Normocephalic and atraumatic.  Right Ear: Tympanic membrane, external ear and ear canal normal.  Left Ear: Tympanic membrane, external ear and ear canal normal.  Nose: Nose normal. No rhinorrhea.  Mouth/Throat: Oropharynx is clear and moist and mucous membranes are normal. No oropharyngeal exudate or posterior oropharyngeal  erythema.  Eyes: Conjunctivae and EOM are normal. Pupils are equal, round, and reactive to light.  Neck: Normal range of motion. Neck supple.  Cardiovascular: Normal rate, regular rhythm, normal heart sounds and intact distal pulses.  Pulmonary/Chest: Effort normal and breath sounds normal.  Abdominal: Soft. Bowel sounds are normal.  Neurological: She is alert and oriented to person, place, and time. She has normal reflexes.  Skin: Skin is warm and dry. No rash noted.  Psychiatric: She has a normal mood and affect. Her behavior is normal. Judgment and thought content normal.  Nursing note and vitals reviewed.       Assessment & Plan:   1. Essential hypertension - hydrochlorothiazide (HYDRODIURIL) 25 MG tablet; Take 1 tablet (25 mg total) by mouth daily.  Dispense: 90 tablet; Refill: 3 - losartan (COZAAR) 100 MG tablet; Take 1 tablet (100 mg total) by mouth daily.  Dispense: 90 tablet; Refill: 3 - CBC with Differential/Platelet - CMP14+EGFR - Lipid panel  2. Generalized anxiety disorder - ALPRAZolam (XANAX) 0.5 MG tablet; Take 1 tablet (0.5 mg total) by mouth 2 (two) times daily as needed for anxiety.  Dispense: 30 tablet; Refill: 1  3. Anxiety - ALPRAZolam (XANAX) 0.5 MG tablet; Take 1 tablet (0.5 mg total) by mouth 2 (two) times daily as needed for anxiety.  Dispense: 30 tablet; Refill: 1  4. Acne rosacea  5. Well adult exam - CBC with Differential/Platelet - CMP14+EGFR - Lipid panel    Current Outpatient Medications:  .  ALPRAZolam (XANAX) 0.5 MG tablet, Take 1 tablet (0.5 mg total) by mouth 2 (two) times daily as needed for anxiety., Disp: 30 tablet, Rfl: 1 .  hydrochlorothiazide (HYDRODIURIL) 25 MG tablet, Take 1 tablet (25 mg total) by mouth daily., Disp: 90 tablet, Rfl: 3 .  losartan (COZAAR) 100 MG tablet, Take 1 tablet (100 mg total) by mouth daily., Disp: 90 tablet, Rfl: 3 .  metroNIDAZOLE (METROGEL) 1 % gel, Apply topically daily., Disp: 45 g, Rfl: 6 Continue all  other maintenance medications as listed above.  Follow up plan: Return in about 1 year (around 02/01/2018) for recheck.  Educational handout given for [place] PA-C [facility] [address]  [place], [place] [address] [phone]   02/01/2017, 10:07 PM

## 2017-02-01 NOTE — Patient Instructions (Signed)
In a few days you may receive a survey in the mail or online from [employer] regarding your visit with us today. Please take a moment to fill this out. Your feedback is very important to our whole office. It can help us better understand your needs as well as improve your experience and satisfaction. Thank you for taking your time to complete it. We care about you.  Tracy Gerken, PA-C  

## 2017-02-02 LAB — CMP14+EGFR
ALT: 32 IU/L (ref 0–32)
AST: 24 IU/L (ref 0–40)
Albumin/Globulin Ratio: 1.9 (ref 1.2–2.2)
Albumin: 4.4 g/dL (ref 3.5–4.8)
Alkaline Phosphatase: 60 IU/L (ref 39–117)
BILIRUBIN TOTAL: 0.4 mg/dL (ref 0.0–1.2)
BUN/Creatinine Ratio: 16 (ref 12–28)
BUN: 16 mg/dL (ref 8–27)
CALCIUM: 9.7 mg/dL (ref 8.7–10.3)
CHLORIDE: 103 mmol/L (ref 96–106)
CO2: 26 mmol/L (ref 20–29)
CREATININE: 1 mg/dL (ref 0.57–1.00)
GFR calc non Af Amer: 56 mL/min/{1.73_m2} — ABNORMAL LOW (ref >59)
GFR, EST AFRICAN AMERICAN: 64 mL/min/{1.73_m2} (ref >59)
GLUCOSE: 98 mg/dL (ref 65–99)
Globulin, Total: 2.3 g/dL (ref 1.5–4.5)
Potassium: 3.8 mmol/L (ref 3.5–5.2)
Sodium: 146 mmol/L — ABNORMAL HIGH (ref 134–144)
TOTAL PROTEIN: 6.7 g/dL (ref 6.0–8.5)

## 2017-02-02 LAB — LIPID PANEL
Chol/HDL Ratio: 4.6 ratio — ABNORMAL HIGH (ref 0.0–4.4)
Cholesterol, Total: 180 mg/dL (ref 100–199)
HDL: 39 mg/dL — AB (ref >39)
LDL CALC: 109 mg/dL — AB (ref 0–99)
Triglycerides: 159 mg/dL — ABNORMAL HIGH (ref 0–149)
VLDL CHOLESTEROL CAL: 32 mg/dL (ref 5–40)

## 2017-02-02 LAB — CBC WITH DIFFERENTIAL/PLATELET
BASOS ABS: 0 10*3/uL (ref 0.0–0.2)
Basos: 0 %
EOS (ABSOLUTE): 0.1 10*3/uL (ref 0.0–0.4)
Eos: 2 %
HEMOGLOBIN: 13.1 g/dL (ref 11.1–15.9)
Hematocrit: 40.5 % (ref 34.0–46.6)
IMMATURE GRANS (ABS): 0 10*3/uL (ref 0.0–0.1)
IMMATURE GRANULOCYTES: 0 %
LYMPHS: 19 %
Lymphocytes Absolute: 1.5 10*3/uL (ref 0.7–3.1)
MCH: 29.2 pg (ref 26.6–33.0)
MCHC: 32.3 g/dL (ref 31.5–35.7)
MCV: 90 fL (ref 79–97)
Monocytes Absolute: 0.7 10*3/uL (ref 0.1–0.9)
Monocytes: 9 %
NEUTROS PCT: 70 %
Neutrophils Absolute: 5.5 10*3/uL (ref 1.4–7.0)
PLATELETS: 218 10*3/uL (ref 150–379)
RBC: 4.48 x10E6/uL (ref 3.77–5.28)
RDW: 14 % (ref 12.3–15.4)
WBC: 7.9 10*3/uL (ref 3.4–10.8)

## 2017-10-13 ENCOUNTER — Telehealth: Payer: Self-pay | Admitting: Physician Assistant

## 2017-11-03 ENCOUNTER — Other Ambulatory Visit: Payer: Self-pay | Admitting: Physician Assistant

## 2017-11-03 DIAGNOSIS — I10 Essential (primary) hypertension: Secondary | ICD-10-CM

## 2017-11-04 NOTE — Telephone Encounter (Signed)
Last seen 02/01/17

## 2017-12-07 ENCOUNTER — Other Ambulatory Visit: Payer: Self-pay | Admitting: Physician Assistant

## 2017-12-07 MED ORDER — FLUCONAZOLE 150 MG PO TABS: 150.0000 mg | ORAL_TABLET | Freq: Once | ORAL | 0 refills | Status: AC

## 2017-12-07 NOTE — Telephone Encounter (Signed)
Sent diflucan

## 2017-12-07 NOTE — Telephone Encounter (Signed)
Patient aware.

## 2018-02-06 ENCOUNTER — Telehealth: Payer: Self-pay | Admitting: Physician Assistant

## 2018-02-06 DIAGNOSIS — I10 Essential (primary) hypertension: Secondary | ICD-10-CM

## 2018-02-06 MED ORDER — HYDROCHLOROTHIAZIDE 25 MG PO TABS: 25.0000 mg | ORAL_TABLET | Freq: Every day | ORAL | 0 refills | Status: DC

## 2018-02-06 NOTE — Telephone Encounter (Signed)
Yes please give 1 month supply.

## 2018-02-06 NOTE — Telephone Encounter (Signed)
Pt has appt with Ronnald Ramp 02/14/18, ok to refill? Last seen 02/01/17.

## 2018-02-06 NOTE — Telephone Encounter (Signed)
30 day supply sent in per Dr Lajuana Ripple and pt is aware.

## 2018-02-07 ENCOUNTER — Other Ambulatory Visit: Payer: Self-pay | Admitting: Physician Assistant

## 2018-02-07 DIAGNOSIS — I10 Essential (primary) hypertension: Secondary | ICD-10-CM

## 2018-02-14 ENCOUNTER — Ambulatory Visit (INDEPENDENT_AMBULATORY_CARE_PROVIDER_SITE_OTHER): Payer: Medicare Other | Admitting: Physician Assistant

## 2018-02-14 ENCOUNTER — Encounter: Payer: Self-pay | Admitting: Physician Assistant

## 2018-02-14 VITALS — BP 138/89 | HR 65 | Temp 97.1°F | Ht 68.0 in | Wt 233.0 lb

## 2018-02-14 DIAGNOSIS — Z0000 Encounter for general adult medical examination without abnormal findings: Secondary | ICD-10-CM

## 2018-02-14 DIAGNOSIS — I10 Essential (primary) hypertension: Secondary | ICD-10-CM

## 2018-02-14 DIAGNOSIS — F411 Generalized anxiety disorder: Secondary | ICD-10-CM | POA: Diagnosis not present

## 2018-02-14 LAB — CBC WITH DIFFERENTIAL/PLATELET
BASOS ABS: 0 10*3/uL (ref 0.0–0.2)
Basos: 0 %
EOS (ABSOLUTE): 0.1 10*3/uL (ref 0.0–0.4)
Eos: 2 %
Hematocrit: 39.8 % (ref 34.0–46.6)
Hemoglobin: 12.9 g/dL (ref 11.1–15.9)
Immature Grans (Abs): 0 10*3/uL (ref 0.0–0.1)
Immature Granulocytes: 0 %
LYMPHS ABS: 1.3 10*3/uL (ref 0.7–3.1)
Lymphs: 21 %
MCH: 29.3 pg (ref 26.6–33.0)
MCHC: 32.4 g/dL (ref 31.5–35.7)
MCV: 90 fL (ref 79–97)
MONOCYTES: 9 %
Monocytes Absolute: 0.6 10*3/uL (ref 0.1–0.9)
Neutrophils Absolute: 4.2 10*3/uL (ref 1.4–7.0)
Neutrophils: 68 %
PLATELETS: 220 10*3/uL (ref 150–450)
RBC: 4.41 x10E6/uL (ref 3.77–5.28)
RDW: 13.2 % (ref 12.3–15.4)
WBC: 6.1 10*3/uL (ref 3.4–10.8)

## 2018-02-14 LAB — CMP14+EGFR
ALK PHOS: 63 IU/L (ref 39–117)
ALT: 21 IU/L (ref 0–32)
AST: 20 IU/L (ref 0–40)
Albumin/Globulin Ratio: 1.8 (ref 1.2–2.2)
Albumin: 4.4 g/dL (ref 3.5–4.8)
BUN/Creatinine Ratio: 12 (ref 12–28)
BUN: 13 mg/dL (ref 8–27)
Bilirubin Total: 0.4 mg/dL (ref 0.0–1.2)
CO2: 26 mmol/L (ref 20–29)
CREATININE: 1.08 mg/dL — AB (ref 0.57–1.00)
Calcium: 9.9 mg/dL (ref 8.7–10.3)
Chloride: 101 mmol/L (ref 96–106)
GFR calc Af Amer: 58 mL/min/{1.73_m2} — ABNORMAL LOW (ref >59)
GFR calc non Af Amer: 50 mL/min/{1.73_m2} — ABNORMAL LOW (ref >59)
Globulin, Total: 2.4 g/dL (ref 1.5–4.5)
Glucose: 88 mg/dL (ref 65–99)
Potassium: 5 mmol/L (ref 3.5–5.2)
SODIUM: 143 mmol/L (ref 134–144)
Total Protein: 6.8 g/dL (ref 6.0–8.5)

## 2018-02-14 LAB — LIPID PANEL
CHOLESTEROL TOTAL: 208 mg/dL — AB (ref 100–199)
Chol/HDL Ratio: 5.5 ratio — ABNORMAL HIGH (ref 0.0–4.4)
HDL: 38 mg/dL — ABNORMAL LOW (ref >39)
LDL Calculated: 146 mg/dL — ABNORMAL HIGH (ref 0–99)
TRIGLYCERIDES: 118 mg/dL (ref 0–149)
VLDL CHOLESTEROL CAL: 24 mg/dL (ref 5–40)

## 2018-02-14 MED ORDER — HYDROCHLOROTHIAZIDE 25 MG PO TABS
25.0000 mg | ORAL_TABLET | Freq: Every day | ORAL | 3 refills | Status: DC
Start: 2018-02-14 — End: 2019-02-14

## 2018-02-14 MED ORDER — ALPRAZOLAM 0.5 MG PO TABS: 0.5000 mg | ORAL_TABLET | Freq: Two times a day (BID) | ORAL | 2 refills | Status: DC | PRN

## 2018-02-14 MED ORDER — LOSARTAN POTASSIUM 100 MG PO TABS: 100.0000 mg | ORAL_TABLET | Freq: Every day | ORAL | 3 refills | Status: DC

## 2018-02-14 NOTE — Progress Notes (Signed)
BP 138/89   Pulse 65   Temp (!) 97.1 F (36.2 C) (Oral)   Ht _0  (1.727 m)   Wt 233 lb (105.7 kg)   BMI 35.43 kg/m    Subjective:    Patient ID: Kristin Wyatt, female    DOB: 13-Aug-1940, 77 y.o.   MRN: [id number]  HPI: Kristin Wyatt is a 77 y.o. female presenting on 02/14/2018 for Hypertension (1 year follow up )  This patient comes in for periodic recheck on medications and conditions including hypertension, and generalized anxiety.  She is tolerating her medications very well with difficulty.  She does need labs performed today.  Her cholesterols been borderline in the past.   All medications are reviewed today. There are no reports of any problems with the medications. All of the medical conditions are reviewed and updated.  Lab work is reviewed and will be ordered as medically necessary. There are no new problems reported with today's visit.   Past Medical History:  Diagnosis Date  . Anxiety   . Arthritis    knees  . Cancer (HCC)    scalp  . Hypertension    Relevant past medical, surgical, family and social history reviewed and updated as indicated. Interim medical history since our last visit reviewed. Allergies and medications reviewed and updated. DATA REVIEWED: CHART IN EPIC  Family History reviewed for pertinent findings.  Review of Systems  Constitutional: Negative.  Negative for activity change, fatigue and fever.  HENT: Negative.   Eyes: Negative.   Respiratory: Negative.  Negative for cough.   Cardiovascular: Negative.  Negative for chest pain.  Gastrointestinal: Negative.  Negative for abdominal pain.  Endocrine: Negative.   Genitourinary: Negative.  Negative for dysuria.  Musculoskeletal: Negative.   Skin: Negative.   Neurological: Negative.     Allergies as of 02/14/2018      Reactions   Morphine And Related Other (See Comments)   Reaction after hysterectomy - not sure what the reaction was      Medication List        Accurate as of  02/14/18 10:02 AM. Always use your most recent med list.          ALPRAZolam 0.5 MG tablet Commonly known as:  XANAX Take 1 tablet (0.5 mg total) by mouth 2 (two) times daily as needed for anxiety.   hydrochlorothiazide 25 MG tablet Commonly known as:  HYDRODIURIL Take 1 tablet (25 mg total) by mouth daily.   losartan 100 MG tablet Commonly known as:  COZAAR Take 1 tablet (100 mg total) by mouth daily.   metroNIDAZOLE 1 % gel Commonly known as:  METROGEL Apply topically daily.          Objective:    BP 138/89   Pulse 65   Temp (!) 97.1 F (36.2 C) (Oral)   Ht _1  (1.727 m)   Wt 233 lb (105.7 kg)   BMI 35.43 kg/m   Allergies  Allergen Reactions  . Morphine And Related Other (See Comments)    Reaction after hysterectomy - not sure what the reaction was    Wt Readings from Last 3 Encounters:  02/14/18 233 lb (105.7 kg)  02/01/17 237 lb (107.5 kg)  01/23/16 236 lb (107 kg)    Physical Exam  Constitutional: She is oriented to person, place, and time. She appears well-developed and well-nourished.  HENT:  Head: Normocephalic and atraumatic.  Right Ear: Tympanic membrane, external ear and ear canal normal.  Left  Ear: Tympanic membrane, external ear and ear canal normal.  Nose: Nose normal. No rhinorrhea.  Mouth/Throat: Oropharynx is clear and moist and mucous membranes are normal. No oropharyngeal exudate or posterior oropharyngeal erythema.  Eyes: Pupils are equal, round, and reactive to light. Conjunctivae and EOM are normal.  Neck: Normal range of motion. Neck supple.  Cardiovascular: Normal rate, regular rhythm, normal heart sounds and intact distal pulses.  Pulmonary/Chest: Effort normal and breath sounds normal.  Abdominal: Soft. Bowel sounds are normal.  Neurological: She is alert and oriented to person, place, and time. She has normal reflexes.  Skin: Skin is warm and dry. No rash noted.  Psychiatric: She has a normal mood and affect. Her behavior is  normal. Judgment and thought content normal.    Results for orders placed or performed in visit on 02/01/17  CBC with Differential/Platelet  Result Value Ref Range   WBC 7.9 3.4 - 10.8 x10E3/uL   RBC 4.48 3.77 - 5.28 x10E6/uL   Hemoglobin 13.1 11.1 - 15.9 g/dL   Hematocrit 40.5 34.0 - 46.6 %   MCV 90 79 - 97 fL   MCH 29.2 26.6 - 33.0 pg   MCHC 32.3 31.5 - 35.7 g/dL   RDW 14.0 12.3 - 15.4 %   Platelets 218 150 - 379 x10E3/uL   Neutrophils 70 Not Estab. %   Lymphs 19 Not Estab. %   Monocytes 9 Not Estab. %   Eos 2 Not Estab. %   Basos 0 Not Estab. %   Neutrophils Absolute 5.5 1.4 - 7.0 x10E3/uL   Lymphocytes Absolute 1.5 0.7 - 3.1 x10E3/uL   Monocytes Absolute 0.7 0.1 - 0.9 x10E3/uL   EOS (ABSOLUTE) 0.1 0.0 - 0.4 x10E3/uL   Basophils Absolute 0.0 0.0 - 0.2 x10E3/uL   Immature Granulocytes 0 Not Estab. %   Immature Grans (Abs) 0.0 0.0 - 0.1 x10E3/uL  CMP14+EGFR  Result Value Ref Range   Glucose 98 65 - 99 mg/dL   BUN 16 8 - 27 mg/dL   Creatinine, Ser 1.00 0.57 - 1.00 mg/dL   GFR calc non Af Amer 56 (L) >59 mL/min/1.73   GFR calc Af Amer 64 >59 mL/min/1.73   BUN/Creatinine Ratio 16 12 - 28   Sodium 146 (H) 134 - 144 mmol/L   Potassium 3.8 3.5 - 5.2 mmol/L   Chloride 103 96 - 106 mmol/L   CO2 26 20 - 29 mmol/L   Calcium 9.7 8.7 - 10.3 mg/dL   Total Protein 6.7 6.0 - 8.5 g/dL   Albumin 4.4 3.5 - 4.8 g/dL   Globulin, Total 2.3 1.5 - 4.5 g/dL   Albumin/Globulin Ratio 1.9 1.2 - 2.2   Bilirubin Total 0.4 0.0 - 1.2 mg/dL   Alkaline Phosphatase 60 39 - 117 IU/L   AST 24 0 - 40 IU/L   ALT 32 0 - 32 IU/L  Lipid panel  Result Value Ref Range   Cholesterol, Total 180 100 - 199 mg/dL   Triglycerides 159 (H) 0 - 149 mg/dL   HDL 39 (L) >39 mg/dL   VLDL Cholesterol Cal 32 5 - 40 mg/dL   LDL Calculated 109 (H) 0 - 99 mg/dL   Chol/HDL Ratio 4.6 (H) 0.0 - 4.4 ratio      Assessment & Plan:   1. Essential hypertension - hydrochlorothiazide (HYDRODIURIL) 25 MG tablet; Take 1 tablet  (25 mg total) by mouth daily.  Dispense: 90 tablet; Refill: 3 - losartan (COZAAR) 100 MG tablet; Take 1 tablet (100 mg  total) by mouth daily.  Dispense: 90 tablet; Refill: 3 - CBC with Differential/Platelet - CMP14+EGFR - Lipid panel  2. Generalized anxiety disorder - ALPRAZolam (XANAX) 0.5 MG tablet; Take 1 tablet (0.5 mg total) by mouth 2 (two) times daily as needed for anxiety.  Dispense: 30 tablet; Refill: 2  3. Well adult exam - CBC with Differential/Platelet - CMP14+EGFR - Lipid panel   Continue all other maintenance medications as listed above.  Follow up plan: No follow-ups on file.  Educational handout given for [place] PA-C [place] [address]  [place], [place] [address] [phone]   02/14/2018, 10:02 AM

## 2018-07-26 ENCOUNTER — Telehealth: Payer: Self-pay | Admitting: Physician Assistant

## 2019-02-14 ENCOUNTER — Encounter: Payer: Self-pay | Admitting: Physician Assistant

## 2019-02-14 ENCOUNTER — Ambulatory Visit (INDEPENDENT_AMBULATORY_CARE_PROVIDER_SITE_OTHER): Payer: Medicare Other | Admitting: Physician Assistant

## 2019-02-14 DIAGNOSIS — F411 Generalized anxiety disorder: Secondary | ICD-10-CM

## 2019-02-14 DIAGNOSIS — I10 Essential (primary) hypertension: Secondary | ICD-10-CM

## 2019-02-14 DIAGNOSIS — Z0000 Encounter for general adult medical examination without abnormal findings: Secondary | ICD-10-CM

## 2019-02-14 MED ORDER — HYDROCHLOROTHIAZIDE 25 MG PO TABS: 25.0000 mg | ORAL_TABLET | Freq: Every day | ORAL | 3 refills | Status: AC

## 2019-02-14 MED ORDER — LOSARTAN POTASSIUM 100 MG PO TABS: 100.0000 mg | ORAL_TABLET | Freq: Every day | ORAL | 3 refills | Status: AC

## 2019-02-14 MED ORDER — ALPRAZOLAM 0.5 MG PO TABS
0.5000 mg | ORAL_TABLET | Freq: Two times a day (BID) | ORAL | 2 refills | Status: AC | PRN
Start: 2019-02-14 — End: ?

## 2019-02-14 NOTE — Progress Notes (Signed)
Telephone visit  Subjective: SP:[id number] chronic medical conditions PCP: Terald Sleeper, PA-C [id number] Kristin Wyatt is a 78 y.o. female calls for telephone consult today. Patient provides verbal consent for consult held via phone.  Patient is identified with 2 separate identifiers.  At this time the entire area is on COVID-19 social distancing and stay home orders are in place.  Patient is of higher risk and therefore we are performing this by a virtual method.  Location of patient: home Location of provider: HOME Others present for call: no  This patient is having a follow-up for her chronic medical conditions which do include hypertension, generalized anxiety.  She states that her blood pressures have been good when she has had opportunity to have them checked.  Is been several months though since she is checked.  I encouraged her if she had a chance to do so and then she can let us know if their readings are out of range.  She denies any chest pain, shortness of breath.  She is doing well with the losartan and hydrochlorothiazide.  She is staying at home and being the safest possible.  She tries to go to the store very early in the morning.  She does have anxiety in which she takes alprazolam on a rare basis PDMP review shows no red flags.  She only had a refill twice in the past year.  Prescription will be sent to the pharmacy.  I have encouraged her to have an appointment in 6 months.  We will try to get her regular check in the summer months.  We will place a lab order for her to come have her annual labs performed in the coming weeks.   ROS: Per HPI  Allergies  Allergen Reactions  . Morphine And Related Other (See Comments)    Reaction after hysterectomy - not sure what the reaction was   Past Medical History:  Diagnosis Date  . Anxiety   . Arthritis    knees  . Cancer (HCC)    scalp  . Hypertension     Current Outpatient Medications:  .  ALPRAZolam (XANAX) 0.5 MG  tablet, Take 1 tablet (0.5 mg total) by mouth 2 (two) times daily as needed for anxiety., Disp: 30 tablet, Rfl: 2 .  hydrochlorothiazide (HYDRODIURIL) 25 MG tablet, Take 1 tablet (25 mg total) by mouth daily., Disp: 90 tablet, Rfl: 3 .  losartan (COZAAR) 100 MG tablet, Take 1 tablet (100 mg total) by mouth daily., Disp: 90 tablet, Rfl: 3  Assessment/ Plan: 78 y.o. female   1. Generalized anxiety disorder - ALPRAZolam (XANAX) 0.5 MG tablet; Take 1 tablet (0.5 mg total) by mouth 2 (two) times daily as needed for anxiety.  Dispense: 30 tablet; Refill: 2  2. Essential hypertension - hydrochlorothiazide (HYDRODIURIL) 25 MG tablet; Take 1 tablet (25 mg total) by mouth daily.  Dispense: 90 tablet; Refill: 3 - losartan (COZAAR) 100 MG tablet; Take 1 tablet (100 mg total) by mouth daily.  Dispense: 90 tablet; Refill: 3  3. Well adult exam - CBC with Differential/Platelet; Future - CMP14+EGFR; Future - Lipid Panel; Future   Return in about 6 months (around 08/15/2019).  Continue all other maintenance medications as listed above.  Start time: 8:34 AM End time: 9:34 AM  Meds ordered this encounter  Medications  . ALPRAZolam (XANAX) 0.5 MG tablet    Sig: Take 1 tablet (0.5 mg total) by mouth 2 (two) times daily as needed for anxiety.  Dispense:  30 tablet    Refill:  2    Order Specific Question:   Supervising Provider    Answer:   Janora Norlander [[id number]]  . hydrochlorothiazide (HYDRODIURIL) 25 MG tablet    Sig: Take 1 tablet (25 mg total) by mouth daily.    Dispense:  90 tablet    Refill:  3    Order Specific Question:   Supervising Provider    Answer:   Janora Norlander [[id number]]  . losartan (COZAAR) 100 MG tablet    Sig: Take 1 tablet (100 mg total) by mouth daily.    Dispense:  90 tablet    Refill:  3    Order Specific Question:   Supervising Provider    Answer:   Janora Norlander [[id number]]    [name] PA-C [place] [phone]   This note is not being shared with the patient for the following reason: To respect privacy (The patient or proxy has requested that the information not be shared).  Marland Kitchen

## 2022-06-01 ENCOUNTER — Other Ambulatory Visit: Payer: Self-pay

## 2022-06-01 ENCOUNTER — Emergency Department (HOSPITAL_COMMUNITY)
Admission: EM | Admit: 2022-06-01 | Discharge: 2022-06-01 | Disposition: A | Discharge status: Home / Self Care | Payer: Medicare Other | Attending: Emergency Medicine | Admitting: Emergency Medicine

## 2022-06-01 ENCOUNTER — Encounter (HOSPITAL_COMMUNITY): Payer: Self-pay | Admitting: *Deleted

## 2022-06-01 DIAGNOSIS — M79604 Pain in right leg: Secondary | ICD-10-CM | POA: Diagnosis not present

## 2022-06-01 LAB — CREATININE, SERUM
Creatinine, Ser: 1.3 mg/dL — ABNORMAL HIGH (ref 0.44–1.00)
GFR, Estimated: 42 mL/min — ABNORMAL LOW (ref >60)

## 2022-06-01 MED ORDER — APIXABAN 5 MG PO TABS
10.0000 mg | ORAL_TABLET | Freq: Two times a day (BID) | ORAL | Status: DC
  Administered 2022-06-01: 10 mg via ORAL
  Filled 2022-06-01: qty 2

## 2022-06-01 NOTE — Discharge Instructions (Addendum)
Today we were unable to perform an ultrasound to evaluate if you have a blood clot, I have ordered for you to come back tomorrow and get an ultrasound done.  You have given 1 dose of blood thinner, to help thin your blood in case you do have a blood clot.  Please return in the a.m. to for the your ultrasound.  If you hit your head, fall please return to the ER.  Blood thinners can make you more likely to have a head bleed.

## 2022-06-01 NOTE — ED Triage Notes (Signed)
Pt sent here for possible DVT to right lower leg.  Takes baby ASA daily.

## 2022-06-01 NOTE — ED Provider Notes (Signed)
[facility] Provider Note   CSN: [id number] Arrival date & time: 06/01/22  1723     History  Chief Complaint  Patient presents with   Leg Pain    Kristin Wyatt is a 82 y.o. female, history of varicose veins, who presents to the ED secondary to right leg pain has been going on for the last 2 weeks.  She states she noticed it suddenly, 2 weeks ago, and that it is very painful when you touch it.  She states it was also painful when walking.  Denies any shortness of breath or chest pain.  Notes she went to the doctor yesterday, had her labs done, and said the blood work looked good, and received a call today, saying that she may go to the ER to rule out a blood clot.  She denies any swelling of the leg, but does state that she had a procedure done on her leg to remove this bad looking mole, and since then has had a lot of problems with it.  She denies any fevers or chills.  No rashes.     Home Medications Prior to Admission medications   Medication Sig Start Date End Date Taking? Authorizing Provider  ALPRAZolam Duanne Moron) 0.5 MG tablet Take 1 tablet (0.5 mg total) by mouth 2 (two) times daily as needed for anxiety. 02/14/19   Terald Sleeper, PA-C  hydrochlorothiazide (HYDRODIURIL) 25 MG tablet Take 1 tablet (25 mg total) by mouth daily. 02/14/19   Terald Sleeper, PA-C  losartan (COZAAR) 100 MG tablet Take 1 tablet (100 mg total) by mouth daily. 02/14/19   Terald Sleeper, PA-C      Allergies    Morphine and related    Review of Systems   Review of Systems  Musculoskeletal:        +R leg pain  Skin:  Negative for rash.    Physical Exam Updated Vital Signs BP (!) 152/85   Pulse 78   Temp (!) 97.5 F (36.4 C)   Resp 16   Ht 5\' 8"  (1.727 m)   Wt 103 kg   SpO2 100%   BMI 34.52 kg/m  Physical Exam Vitals and nursing note reviewed.  Constitutional:      General: She is not in acute distress.    Appearance: She is well-developed.   HENT:     Head: Normocephalic and atraumatic.  Eyes:     Conjunctiva/sclera: Conjunctivae normal.  Cardiovascular:     Rate and Rhythm: Normal rate and regular rhythm.     Heart sounds: No murmur heard.    Comments: Tenderness to palpation of right lower extremity, along venous line, including popliteal, gastroc. Pulmonary:     Effort: Pulmonary effort is normal. No respiratory distress.     Breath sounds: Normal breath sounds.  Abdominal:     Palpations: Abdomen is soft.     Tenderness: There is no abdominal tenderness.  Musculoskeletal:        General: No swelling.     Cervical back: Neck supple.     Comments: Range of motion within normal limits.  No pitting edema.  Skin:    General: Skin is warm and dry.     Capillary Refill: Capillary refill takes less than 2 seconds.     Comments: No rash  Neurological:     Mental Status: She is alert.  Psychiatric:        Mood and Affect: Mood normal.  ED Results / Procedures / Treatments   Labs (all labs ordered are listed, but only abnormal results are displayed) Labs Reviewed  CREATININE, SERUM - Abnormal; Notable for the following components:      Result Value   Creatinine, Ser 1.30 (*)    GFR, Estimated 42 (*)    All other components within normal limits    EKG None  Radiology No results found.  Procedures Procedures    Medications Ordered in ED Medications  apixaban (ELIQUIS) tablet 10 mg (10 mg Oral Given 06/01/22 2031)    ED Course/ Medical Decision Making/ A&P                             Medical Decision Making Patient is a 82 year old female, here for rule out of a DVT.  She has had some right calf pain for the last 2 to 3 weeks after having a procedure done to her leg to get a mole rid of it.  She states that they had a area of the vein, when they remove the removal and she has had issues with it since then.  She has been on daily be aspirin.  She does not have any edema of her extremity, however she  does have some tenderness to palpation of the popliteal and gastroc, venous system.  I am concerned for possible DVT except for the DVT ultrasound is not available at this time.  We discussed follow-up DVT study in the a.m. and I will give her a dose of Eliquis and have her follow-up for DVT scan right in the morning.  We discussed return precautions and she voiced understanding, lab obtained to evaluate dosing of the Eliquis.  Return precautions include shortness of breath, chest pain, or worsening pain in the leg.  Or no feeling in the leg.  She has a good pulse.  Amount and/or Complexity of Data Reviewed Labs: ordered.  Risk Prescription drug management.    Final Clinical Impression(s) / ED Diagnoses Final diagnoses:  Right leg pain    Rx / DC Orders ED Discharge Orders          Ordered    Lower Ext Right Venous US       Comments: IMPORTANT PATIENT INSTRUCTIONS:  Your ED provider has recommended an Outpatient Ultrasound.  Please call [phone] to schedule an appointment.  If your appointment is scheduled for a Saturday, Sunday or holiday, please go to the [facility] Emergency Department Registration Desk at least 15 minutes prior to your appointment time and tell them you are there for an ultrasound.    If your appointment is scheduled for a weekday (Monday-Friday), please go directly to the [facility] Radiology Department at least 15 minutes prior to your appointment time and tell them you are there for an ultrasound.  Please call [phone] with questions.   06/01/22 1813              Osvaldo Shipper, PA 06/01/22 2245    Milton Ferguson, MD 06/03/22 1135

## 2022-06-02 ENCOUNTER — Ambulatory Visit (HOSPITAL_COMMUNITY)
Admission: RE | Admit: 2022-06-02 | Discharge: 2022-06-02 | Disposition: A | Discharge status: Home / Self Care | Payer: Medicare Other | Source: Ambulatory Visit | Attending: Medical | Admitting: Medical

## 2022-06-02 DIAGNOSIS — M79604 Pain in right leg: Secondary | ICD-10-CM | POA: Diagnosis present

## 2022-06-02 NOTE — ED Provider Notes (Signed)
  Patient was seen here last evening and outpatient ultrasound of the right lower extremity was ordered.  Seen for right calf pain  Returned this morning for scheduled ultrasound imaging.  Negative for acute DVT.  Discussed findings with patient.  She is agreeable to close outpatient follow-up with PCP.   US Venous Img Lower Unilateral Right (DVT)  Result Date: 06/02/2022 CLINICAL DATA:  Right leg pain.  Recent surgery. EXAM: RIGHT LOWER EXTREMITY VENOUS DOPPLER ULTRASOUND TECHNIQUE: Gray-scale sonography with compression, as well as color and duplex ultrasound, were performed to evaluate the deep venous system(s) from the level of the common femoral vein through the popliteal and proximal calf veins. COMPARISON:  None Available. FINDINGS: VENOUS Normal compressibility of the common femoral, superficial femoral, and popliteal veins, as well as the visualized calf veins. Visualized portions of profunda femoral vein and great saphenous vein unremarkable. No filling defects to suggest DVT on grayscale or color Doppler imaging. Doppler waveforms show normal direction of venous flow, normal respiratory plasticity and response to augmentation. Limited views of the contralateral common femoral vein are unremarkable. OTHER None. Limitations: none IMPRESSION: Negative. Electronically Signed   By: Lajean Manes M.D.   On: 06/02/2022 10:37      Kem Parkinson, PA-C 06/02/22 [place], [place], MD 06/02/22 2133
